# Patient Record
Sex: Male | Born: 1935 | Race: White | Hispanic: No | Marital: Married | Smoking: Former smoker
Health system: Southern US, Community
[De-identification: ages and names within clinical notes are randomized; demographics above are authoritative.]

## PROBLEM LIST (undated history)

## (undated) DIAGNOSIS — T7840XA Allergy, unspecified, initial encounter: Secondary | ICD-10-CM

## (undated) DIAGNOSIS — F0280 Dementia in other diseases classified elsewhere without behavioral disturbance: Secondary | ICD-10-CM

## (undated) DIAGNOSIS — L719 Rosacea, unspecified: Secondary | ICD-10-CM

## (undated) DIAGNOSIS — I10 Essential (primary) hypertension: Secondary | ICD-10-CM

## (undated) DIAGNOSIS — E785 Hyperlipidemia, unspecified: Secondary | ICD-10-CM

## (undated) DIAGNOSIS — M1990 Unspecified osteoarthritis, unspecified site: Secondary | ICD-10-CM

## (undated) DIAGNOSIS — K219 Gastro-esophageal reflux disease without esophagitis: Secondary | ICD-10-CM

## (undated) DIAGNOSIS — C4490 Unspecified malignant neoplasm of skin, unspecified: Secondary | ICD-10-CM

## (undated) DIAGNOSIS — J45909 Unspecified asthma, uncomplicated: Secondary | ICD-10-CM

## (undated) HISTORY — PX: SINUS LIFT WITH BONE GRAFT: SHX5306

## (undated) HISTORY — PX: REPLACEMENT TOTAL KNEE BILATERAL: SUR1225

## (undated) HISTORY — DX: Rosacea, unspecified: L71.9

## (undated) HISTORY — DX: Hyperlipidemia, unspecified: E78.5

## (undated) HISTORY — DX: Unspecified malignant neoplasm of skin, unspecified: C44.90

## (undated) HISTORY — DX: Dementia in other diseases classified elsewhere, unspecified severity, without behavioral disturbance, psychotic disturbance, mood disturbance, and anxiety: F02.80

## (undated) HISTORY — DX: Gastro-esophageal reflux disease without esophagitis: K21.9

## (undated) HISTORY — DX: Allergy, unspecified, initial encounter: T78.40XA

## (undated) HISTORY — DX: Unspecified asthma, uncomplicated: J45.909

## (undated) HISTORY — DX: Essential (primary) hypertension: I10

## (undated) HISTORY — DX: Unspecified osteoarthritis, unspecified site: M19.90

## (undated) HISTORY — PX: TONSILLECTOMY: SUR1361

---

## 2009-09-18 DIAGNOSIS — Z961 Presence of intraocular lens: Secondary | ICD-10-CM | POA: Insufficient documentation

## 2009-10-30 DIAGNOSIS — Z961 Presence of intraocular lens: Secondary | ICD-10-CM | POA: Insufficient documentation

## 2011-06-18 DIAGNOSIS — I872 Venous insufficiency (chronic) (peripheral): Secondary | ICD-10-CM | POA: Insufficient documentation

## 2011-06-18 DIAGNOSIS — E782 Mixed hyperlipidemia: Secondary | ICD-10-CM | POA: Insufficient documentation

## 2011-06-18 DIAGNOSIS — L719 Rosacea, unspecified: Secondary | ICD-10-CM | POA: Insufficient documentation

## 2011-06-18 DIAGNOSIS — K3189 Other diseases of stomach and duodenum: Secondary | ICD-10-CM | POA: Insufficient documentation

## 2011-06-18 DIAGNOSIS — M159 Polyosteoarthritis, unspecified: Secondary | ICD-10-CM | POA: Insufficient documentation

## 2011-06-18 DIAGNOSIS — J301 Allergic rhinitis due to pollen: Secondary | ICD-10-CM | POA: Insufficient documentation

## 2012-04-29 DIAGNOSIS — M1710 Unilateral primary osteoarthritis, unspecified knee: Secondary | ICD-10-CM | POA: Insufficient documentation

## 2012-04-29 DIAGNOSIS — Z96659 Presence of unspecified artificial knee joint: Secondary | ICD-10-CM | POA: Insufficient documentation

## 2012-05-05 ENCOUNTER — Encounter: Payer: Self-pay | Admitting: Orthopedic Surgery

## 2012-05-09 ENCOUNTER — Encounter: Payer: Self-pay | Admitting: Orthopedic Surgery

## 2012-06-08 ENCOUNTER — Encounter: Payer: Self-pay | Admitting: Orthopedic Surgery

## 2014-04-05 DIAGNOSIS — IMO0002 Reserved for concepts with insufficient information to code with codable children: Secondary | ICD-10-CM | POA: Insufficient documentation

## 2014-05-09 ENCOUNTER — Ambulatory Visit: Payer: Self-pay | Admitting: Specialist

## 2014-08-12 DIAGNOSIS — Z85828 Personal history of other malignant neoplasm of skin: Secondary | ICD-10-CM | POA: Insufficient documentation

## 2015-04-26 DIAGNOSIS — M1611 Unilateral primary osteoarthritis, right hip: Secondary | ICD-10-CM | POA: Insufficient documentation

## 2015-04-27 DIAGNOSIS — Z96651 Presence of right artificial knee joint: Secondary | ICD-10-CM | POA: Insufficient documentation

## 2016-08-27 DIAGNOSIS — B351 Tinea unguium: Secondary | ICD-10-CM | POA: Insufficient documentation

## 2016-08-27 DIAGNOSIS — M2041 Other hammer toe(s) (acquired), right foot: Secondary | ICD-10-CM | POA: Insufficient documentation

## 2016-08-27 DIAGNOSIS — M2042 Other hammer toe(s) (acquired), left foot: Secondary | ICD-10-CM | POA: Insufficient documentation

## 2016-08-27 DIAGNOSIS — R234 Changes in skin texture: Secondary | ICD-10-CM | POA: Insufficient documentation

## 2017-09-09 ENCOUNTER — Emergency Department
Admission: EM | Admit: 2017-09-09 | Discharge: 2017-09-09 | Disposition: A | Discharge status: Home / Self Care | Payer: Medicare Other | Attending: Emergency Medicine | Admitting: Emergency Medicine

## 2017-09-09 ENCOUNTER — Emergency Department: Payer: Medicare Other

## 2017-09-09 ENCOUNTER — Encounter: Payer: Self-pay | Admitting: Medical Oncology

## 2017-09-09 DIAGNOSIS — Y999 Unspecified external cause status: Secondary | ICD-10-CM | POA: Insufficient documentation

## 2017-09-09 DIAGNOSIS — S91111A Laceration without foreign body of right great toe without damage to nail, initial encounter: Secondary | ICD-10-CM | POA: Diagnosis present

## 2017-09-09 DIAGNOSIS — Y92017 Garden or yard in single-family (private) house as the place of occurrence of the external cause: Secondary | ICD-10-CM | POA: Diagnosis not present

## 2017-09-09 DIAGNOSIS — W268XXA Contact with other sharp object(s), not elsewhere classified, initial encounter: Secondary | ICD-10-CM | POA: Insufficient documentation

## 2017-09-09 DIAGNOSIS — Y93H2 Activity, gardening and landscaping: Secondary | ICD-10-CM | POA: Diagnosis not present

## 2017-09-09 MED ORDER — SULFAMETHOXAZOLE-TRIMETHOPRIM 800-160 MG PO TABS: 1.0000 | ORAL_TABLET | Freq: Two times a day (BID) | ORAL | 0 refills | Status: DC

## 2017-09-09 MED ORDER — LIDOCAINE HCL (PF) 1 % IJ SOLN
5.0000 mL | Freq: Once | INTRAMUSCULAR | Status: AC
  Administered 2017-09-09: 5 mL
  Filled 2017-09-09: qty 5

## 2017-09-09 NOTE — ED Triage Notes (Signed)
Pt ambulatory with reports that he stubbed his rt great toe on a stump and there is splinters under his skin.

## 2017-09-09 NOTE — ED Provider Notes (Signed)
[facility] Emergency Department Provider Note   ____________________________________________   I have reviewed the triage vital signs and the nursing notes.   HISTORY  Chief Complaint Toe Injury    HPI Curtis West is a 83 y.o. male presents to emergency department with laceration to the plantar aspect of the right great toe he sustained approximately 11 AM this morning. Patient reports he was gardening in his yard when he stumbled over a metal border sustaining a laceration. Patient denies any other injuries Patient immediately flushed the wound in the shower however he is unsure if there was any remaining debris in the wound. Patient is up-to-date on his tetanus vaccination. Patient reports intact movement and sensation to the right great toe Patient denies fever, chills, headache, vision changes, chest pain, chest tightness, shortness of breath, abdominal pain, nausea and vomiting.  History reviewed. No pertinent past medical history.  There are no active problems to display for this patient.   No past surgical history on file.  Prior to Admission medications   Medication Sig Start Date End Date Taking? Authorizing Provider  sulfamethoxazole-trimethoprim (BACTRIM DS,SEPTRA DS) 800-160 MG tablet Take 1 tablet by mouth 2 (two) times daily. 09/09/17   Chrislyn Seedorf M, PA-C    Allergies Amoxicillin  No family history on file.  Social History Social History  Substance Use Topics  . Smoking status: Not on file  . Smokeless tobacco: Not on file  . Alcohol use Not on file    Review of Systems Constitutional: Negative for fever/chills Musculoskeletal: positive for right great toe pain secondary to laceration. Skin: Negative for rash. Laceration along the plantar aspect of the right great toe. Neurological: Negative for headaches.  ____________________________________________   PHYSICAL EXAM:  VITAL SIGNS: ED Triage Vitals  Enc Vitals  Group     BP 09/09/17 1306 (!) 144/86     Pulse Rate 09/09/17 1306 79     Resp 09/09/17 1306 18     Temp 09/09/17 1306 97.7 F (36.5 C)     Temp Source 09/09/17 1306 Oral     SpO2 09/09/17 1306 94 %     Weight 09/09/17 1304 210 lb (95.3 kg)     Height 09/09/17 1304 6' (1.829 m)     Head Circumference --      Peak Flow --      Pain Score 09/09/17 1303 0     Pain Loc --      Pain Edu? --      Excl. in [place]? --     Constitutional: Alert and oriented. Well appearing and in no acute distress.  Head: Normocephalic and atraumatic. Cardiovascular: Normal rate, regular rhythm.  Good peripheral circulation. Respiratory: Normal respiratory effort without tachypnea or retractions. Good air entry to the bases with no decreased or absent breath sounds. Musculoskeletal: right great toe range of motion, sensation and motor intact. Neurologic: Normal speech and language. Skin:  Skin is warm, dry and intact. No rash noted. Laceration along the right great toe plantar aspect. Proximally 3 cm. No tendinous involvement.  Psychiatric: Mood and affect are normal. Speech and behavior are normal. Patient exhibits appropriate insight and judgement.  ____________________________________________   LABS (all labs ordered are listed, but only abnormal results are displayed)  Labs Reviewed - No data to display ____________________________________________  EKG none ____________________________________________  RADIOLOGY DG right great toe FINDINGS: Soft tissue laceration is noted consistent the given clinical history. No acute fracture or dislocation is noted. No radiopaque foreign  body is noted.  IMPRESSION: Soft tissue laceration without acute abnormality.  Imaging results reviewed, unremarkable for foreign body or acute fracture. ____________________________________________   PROCEDURES  Procedure(s) performed:  LACERATION REPAIR Performed by: Jerolyn Shin Authorized by: Jerolyn Shin Consent: Verbal consent obtained. Risks and benefits: risks, benefits and alternatives were discussed Consent given by: patient Patient identity confirmed: provided demographic data Prepped and Draped in normal sterile fashion Wound explored  Laceration Location: Right great toe, plantar aspect  Laceration Length: 3.0 cm  No Foreign Bodies seen or palpated  Anesthesia: local infiltration  Local anesthetic: lidocaine 1%  Anesthetic total: 4.0 ml  Irrigation method: syringe Amount of cleaning: standard  Skin closure: Ethilon 5-0, Monocryl 5-0  Number of sutures: Ethilon 5-0:  9 sutures; Monocryl 5-0 : 1 sutures  Technique: simple interrupted  Patient tolerance: Patient tolerated the procedure well with no immediate complications.    Critical Care performed: no ____________________________________________   INITIAL IMPRESSION / ASSESSMENT AND PLAN / ED COURSE  Pertinent labs & imaging results that were available during my care of the patient were reviewed by me and considered in my medical decision making (see chart for details).  Patient sustained a laceration right great toe  Assessment confirmed movement and sensation of the digit before and after wound closure. Laceration required suture closure as noted above. Patient tolerated procedure well. Patient prescribed Bactrim for about coverage. Pt instructed to keep wound clean and dry and will return to the emergency department or PCP for suture removal in 7-10 days days. Patient also instructed to watch for signs of infection and return if changes are noted. Patient informed of clinical course, understand medical decision-making process, and agree with plan.  ____________________________________________   FINAL CLINICAL IMPRESSION(S) / ED DIAGNOSES  Final diagnoses:  Laceration of right great toe without foreign body present or damage to nail, initial encounter       NEW MEDICATIONS STARTED DURING THIS  VISIT:  Discharge Medication List as of 09/09/2017  3:41 PM    START taking these medications   Details  sulfamethoxazole-trimethoprim (BACTRIM DS,SEPTRA DS) 800-160 MG tablet Take 1 tablet by mouth 2 (two) times daily., Starting Tue 09/09/2017, Print         Note:  This document was prepared using Dragon voice recognition software and may include unintentional dictation errors.    Jerolyn Shin, PA-C 09/09/17 1559    Lavonia Drafts, MD 09/12/17 [phone]

## 2017-09-09 NOTE — Discharge Instructions (Signed)
Take medication as prescribed. Return to emergency department if symptoms worsen and follow-up with PCP as needed.    Keep wound area clean and dry and covered and dusty dirty or wet environments. Use caution when walking to minimize flexing the toe this can put stress on the healing wound.  Continue to monitor the wound, if you notice signs of developing infection do not hesitate to follow up with her primary care or return to the emergency department.

## 2017-09-09 NOTE — ED Notes (Signed)
Pt states he was in the garden with sandals on on his right big toe got caught on a metal piece in the garden, arrives with laceration to the back of the right big toe, bleeding controlled, states tetanus status is up to date

## 2018-01-30 DIAGNOSIS — H26492 Other secondary cataract, left eye: Secondary | ICD-10-CM | POA: Insufficient documentation

## 2018-02-03 DIAGNOSIS — D649 Anemia, unspecified: Secondary | ICD-10-CM | POA: Insufficient documentation

## 2018-02-03 DIAGNOSIS — R1319 Other dysphagia: Secondary | ICD-10-CM | POA: Insufficient documentation

## 2018-02-23 DIAGNOSIS — G4733 Obstructive sleep apnea (adult) (pediatric): Secondary | ICD-10-CM | POA: Insufficient documentation

## 2018-11-09 ENCOUNTER — Ambulatory Visit: Payer: Medicare Other | Admitting: Physical Therapy

## 2018-11-11 ENCOUNTER — Ambulatory Visit: Payer: Medicare Other | Admitting: Physical Therapy

## 2018-11-16 ENCOUNTER — Ambulatory Visit: Payer: Medicare Other | Admitting: Physical Therapy

## 2018-11-18 ENCOUNTER — Ambulatory Visit: Payer: Medicare Other | Admitting: Physical Therapy

## 2018-11-23 ENCOUNTER — Ambulatory Visit: Payer: Medicare Other | Admitting: Physical Therapy

## 2018-11-25 ENCOUNTER — Ambulatory Visit: Payer: Medicare Other | Admitting: Physical Therapy

## 2018-12-03 ENCOUNTER — Ambulatory Visit: Payer: 59 | Admitting: Physical Therapy

## 2018-12-07 ENCOUNTER — Ambulatory Visit: Payer: 59 | Admitting: Physical Therapy

## 2018-12-14 ENCOUNTER — Ambulatory Visit: Payer: 59 | Admitting: Physical Therapy

## 2018-12-16 ENCOUNTER — Ambulatory Visit: Payer: 59 | Admitting: Physical Therapy

## 2018-12-21 ENCOUNTER — Ambulatory Visit: Payer: 59 | Admitting: Physical Therapy

## 2018-12-23 ENCOUNTER — Ambulatory Visit: Payer: 59 | Admitting: Physical Therapy

## 2018-12-28 ENCOUNTER — Ambulatory Visit: Payer: 59 | Admitting: Physical Therapy

## 2018-12-30 ENCOUNTER — Ambulatory Visit: Payer: 59 | Admitting: Physical Therapy

## 2019-01-04 ENCOUNTER — Ambulatory Visit: Payer: 59 | Admitting: Physical Therapy

## 2019-01-06 ENCOUNTER — Ambulatory Visit: Payer: 59 | Admitting: Physical Therapy

## 2019-01-11 ENCOUNTER — Ambulatory Visit: Payer: 59 | Admitting: Physical Therapy

## 2019-01-13 ENCOUNTER — Ambulatory Visit: Payer: 59 | Admitting: Physical Therapy

## 2019-05-06 ENCOUNTER — Other Ambulatory Visit: Payer: Self-pay

## 2019-05-06 ENCOUNTER — Emergency Department
Admission: EM | Admit: 2019-05-06 | Discharge: 2019-05-06 | Disposition: A | Discharge status: Home / Self Care | Payer: Medicare Other | Attending: Emergency Medicine | Admitting: Emergency Medicine

## 2019-05-06 ENCOUNTER — Emergency Department: Payer: Medicare Other

## 2019-05-06 DIAGNOSIS — Z87891 Personal history of nicotine dependence: Secondary | ICD-10-CM | POA: Insufficient documentation

## 2019-05-06 DIAGNOSIS — Z20828 Contact with and (suspected) exposure to other viral communicable diseases: Secondary | ICD-10-CM | POA: Diagnosis not present

## 2019-05-06 DIAGNOSIS — I491 Atrial premature depolarization: Secondary | ICD-10-CM | POA: Insufficient documentation

## 2019-05-06 DIAGNOSIS — R05 Cough: Secondary | ICD-10-CM | POA: Diagnosis not present

## 2019-05-06 DIAGNOSIS — R008 Other abnormalities of heart beat: Secondary | ICD-10-CM | POA: Diagnosis present

## 2019-05-06 DIAGNOSIS — I10 Essential (primary) hypertension: Secondary | ICD-10-CM | POA: Diagnosis not present

## 2019-05-06 LAB — COMPREHENSIVE METABOLIC PANEL
ALT: 17 U/L (ref 0–44)
AST: 21 U/L (ref 15–41)
Albumin: 4.3 g/dL (ref 3.5–5.0)
Alkaline Phosphatase: 92 U/L (ref 38–126)
Anion gap: 9 (ref 5–15)
BUN: 23 mg/dL (ref 8–23)
CO2: 28 mmol/L (ref 22–32)
Calcium: 9.1 mg/dL (ref 8.9–10.3)
Chloride: 95 mmol/L — ABNORMAL LOW (ref 98–111)
Creatinine, Ser: 1.25 mg/dL — ABNORMAL HIGH (ref 0.61–1.24)
GFR calc Af Amer: >60 mL/min (ref >60)
GFR calc non Af Amer: 52 mL/min — ABNORMAL LOW (ref >60)
Glucose, Bld: 111 mg/dL — ABNORMAL HIGH (ref 70–99)
Potassium: 3.4 mmol/L — ABNORMAL LOW (ref 3.5–5.1)
Sodium: 132 mmol/L — ABNORMAL LOW (ref 135–145)
Total Bilirubin: 0.5 mg/dL (ref 0.3–1.2)
Total Protein: 7.7 g/dL (ref 6.5–8.1)

## 2019-05-06 LAB — CBC
HCT: 40.4 % (ref 39.0–52.0)
Hemoglobin: 13 g/dL (ref 13.0–17.0)
MCH: 25.2 pg — ABNORMAL LOW (ref 26.0–34.0)
MCHC: 32.2 g/dL (ref 30.0–36.0)
MCV: 78.4 fL — ABNORMAL LOW (ref 80.0–100.0)
Platelets: 290 10*3/uL (ref 150–400)
RBC: 5.15 MIL/uL (ref 4.22–5.81)
RDW: 14.4 % (ref 11.5–15.5)
WBC: 6.5 10*3/uL (ref 4.0–10.5)
nRBC: 0 % (ref 0.0–0.2)

## 2019-05-06 LAB — BRAIN NATRIURETIC PEPTIDE: B Natriuretic Peptide: 38 pg/mL (ref 0.0–100.0)

## 2019-05-06 LAB — TROPONIN I: Troponin I: <0.03 ng/mL (ref <0.03)

## 2019-05-06 LAB — SARS CORONAVIRUS 2 BY RT PCR (HOSPITAL ORDER, PERFORMED IN ~~LOC~~ HOSPITAL LAB): SARS Coronavirus 2: NEGATIVE

## 2019-05-06 NOTE — ED Triage Notes (Signed)
Pt c/o feeling a irregular heart beat and some SOB for the past couple of days, with congestion.

## 2019-05-06 NOTE — ED Notes (Signed)
Pt ambulated from triage to Rm 31. Pt speaking with Dr. Kerman Passey at this time. NAD and no SOB noted. A&Ox4

## 2019-05-06 NOTE — ED Provider Notes (Signed)
[facility] Emergency Department Provider Note  Time seen: 10:05 AM  I have reviewed the triage vital signs and the nursing notes.   HISTORY  Chief Complaint Irregular Heart Beat    HPI Curtis West is a 85 y.o. male with a past medical history of arthritis, anemia, hypertension, hyperlipidemia, seizures presents to the emergency department for an irregular heartbeat.  According to the patient his wife is a Marine scientist and often will check his pulse and blood pressure.  He states today his pulse was very irregular so his wife wanted him to come get checked out.  Patient denies any shortness of breath.  Does state occasional cough but denies any fever.  Denies any history of atrial fibrillation or irregular heartbeat in the past.  Overall the patient appears well with no acute concerns.  History reviewed. No pertinent past medical history.  There are no active problems to display for this patient.   Past Surgical History:  Procedure Laterality Date  . TONSILLECTOMY      Prior to Admission medications   Medication Sig Start Date End Date Taking? Authorizing Provider  sulfamethoxazole-trimethoprim (BACTRIM DS,SEPTRA DS) 800-160 MG tablet Take 1 tablet by mouth 2 (two) times daily. 09/09/17   Little, Traci M, PA-C    Allergies  Allergen Reactions  . Amoxicillin     No family history on file.  Social History Social History   Tobacco Use  . Smoking status: Former Research scientist (life sciences)  . Smokeless tobacco: Never Used  Substance Use Topics  . Alcohol use: Not Currently  . Drug use: Not Currently    Review of Systems Constitutional: Negative for fever. Cardiovascular: Negative for chest pain.  Irregular pulse rate today. Respiratory: Negative for shortness of breath. Gastrointestinal: Negative for abdominal pain, vomiting  Musculoskeletal: Negative for musculoskeletal complaints Neurological: Negative for headache All other ROS  negative  ____________________________________________   PHYSICAL EXAM:  VITAL SIGNS: ED Triage Vitals  Enc Vitals Group     BP 05/06/19 0939 133/80     Pulse Rate 05/06/19 0939 88     Resp 05/06/19 0939 17     Temp 05/06/19 0939 97.9 F (36.6 C)     Temp Source 05/06/19 0939 Oral     SpO2 05/06/19 0939 96 %     Weight 05/06/19 0941 200 lb (90.7 kg)     Height 05/06/19 0941 6' (1.829 m)     Head Circumference --      Peak Flow --      Pain Score 05/06/19 0941 0     Pain Loc --      Pain Edu? --      Excl. in [place]? --    Constitutional: Alert and oriented. Well appearing and in no distress. Eyes: Normal exam ENT      Head: Normocephalic and atraumatic.      Mouth/Throat: Mucous membranes are moist. Cardiovascular: Normal rate, largely regular rhythm with occasional irregularity possible PVC/PAC. Respiratory: Normal respiratory effort without tachypnea nor retractions. Breath sounds are clear  Gastrointestinal: Soft and nontender. No distention. Musculoskeletal: Nontender with normal range of motion in all extremities Neurologic:  Normal speech and language. No gross focal neurologic deficits  Skin:  Skin is warm, dry and intact.  Psychiatric: Mood and affect are normal.  ____________________________________________    EKG  EKG viewed and interpreted by myself shows a normal sinus rhythm 86 bpm with a narrow QRS, normal axis, normal intervals, no concerning ST changes.  ____________________________________________  RADIOLOGY   Chest x-ray is negative  ____________________________________________   INITIAL IMPRESSION / ASSESSMENT AND PLAN / ED COURSE  Pertinent labs & imaging results that were available during my care of the patient were reviewed by me and considered in my medical decision making (see chart for details).   Patient presents emergency department for an irregular heartbeat found by his wife this morning.  Patient states he has no symptoms.  Denies  feeling his heart beat irregularly.  Denies feeling short of breath.  Does state occasional cough.  On examination patient appears to have a largely regular rhythm with occasional ectopic beat.  EKG appears to show a normal sinus rhythm.  On rhythm strip he does appear to have occasional PACs.  We will check labs a chest x-ray and a corona swab as a precaution.  Patient agreeable to plan of care.  Patient's work-up is essentially nonrevealing.  Negative troponin.  Normal chest x-ray.  Corona test is negative.  Highly suspect PACs to be the cause of the patient's palpitation or irregular heartbeat noted this morning.  Patient will follow-up with his doctor.    Curtis West was evaluated in Emergency Department on 05/06/2019 for the symptoms described in the history of present illness. He was evaluated in the context of the global COVID-19 pandemic, which necessitated consideration that the patient might be at risk for infection with the SARS-CoV-2 virus that causes COVID-19. Institutional protocols and algorithms that pertain to the evaluation of patients at risk for COVID-19 are in a state of rapid change based on information released by regulatory bodies including the CDC and federal and state organizations. These policies and algorithms were followed during the patient's care in the ED.  ____________________________________________   FINAL CLINICAL IMPRESSION(S) / ED DIAGNOSES  Premature atrial complexes Palpitations   Harvest Dark, MD 05/06/19 1210

## 2019-05-06 NOTE — ED Notes (Signed)
Pt in bed resting comfortably.

## 2019-12-15 ENCOUNTER — Emergency Department: Payer: Medicare Other

## 2019-12-15 ENCOUNTER — Other Ambulatory Visit: Payer: Self-pay

## 2019-12-15 ENCOUNTER — Emergency Department
Admission: EM | Admit: 2019-12-15 | Discharge: 2019-12-15 | Disposition: A | Discharge status: Home / Self Care | Payer: Medicare Other | Attending: Student | Admitting: Student

## 2019-12-15 DIAGNOSIS — I10 Essential (primary) hypertension: Secondary | ICD-10-CM | POA: Diagnosis not present

## 2019-12-15 DIAGNOSIS — R1011 Right upper quadrant pain: Secondary | ICD-10-CM

## 2019-12-15 DIAGNOSIS — J45909 Unspecified asthma, uncomplicated: Secondary | ICD-10-CM | POA: Diagnosis not present

## 2019-12-15 DIAGNOSIS — K8050 Calculus of bile duct without cholangitis or cholecystitis without obstruction: Secondary | ICD-10-CM | POA: Diagnosis not present

## 2019-12-15 DIAGNOSIS — Z87891 Personal history of nicotine dependence: Secondary | ICD-10-CM | POA: Diagnosis not present

## 2019-12-15 DIAGNOSIS — F0390 Unspecified dementia without behavioral disturbance: Secondary | ICD-10-CM | POA: Insufficient documentation

## 2019-12-15 LAB — CBC
HCT: 38.6 % — ABNORMAL LOW (ref 39.0–52.0)
Hemoglobin: 12.5 g/dL — ABNORMAL LOW (ref 13.0–17.0)
MCH: 27.5 pg (ref 26.0–34.0)
MCHC: 32.4 g/dL (ref 30.0–36.0)
MCV: 84.8 fL (ref 80.0–100.0)
Platelets: 275 10*3/uL (ref 150–400)
RBC: 4.55 MIL/uL (ref 4.22–5.81)
RDW: 14.1 % (ref 11.5–15.5)
WBC: 11.4 10*3/uL — ABNORMAL HIGH (ref 4.0–10.5)
nRBC: 0 % (ref 0.0–0.2)

## 2019-12-15 LAB — URINALYSIS, COMPLETE (UACMP) WITH MICROSCOPIC
Bacteria, UA: NONE SEEN
Bilirubin Urine: NEGATIVE
Glucose, UA: NEGATIVE mg/dL
Hgb urine dipstick: NEGATIVE
Ketones, ur: NEGATIVE mg/dL
Leukocytes,Ua: NEGATIVE
Nitrite: NEGATIVE
Protein, ur: 30 mg/dL — AB
Specific Gravity, Urine: 1.019 (ref 1.005–1.030)
Squamous Epithelial / HPF: NONE SEEN (ref 0–5)
pH: 6 (ref 5.0–8.0)

## 2019-12-15 LAB — COMPREHENSIVE METABOLIC PANEL
ALT: 16 U/L (ref 0–44)
AST: 17 U/L (ref 15–41)
Albumin: 3.1 g/dL — ABNORMAL LOW (ref 3.5–5.0)
Alkaline Phosphatase: 83 U/L (ref 38–126)
Anion gap: 9 (ref 5–15)
BUN: 20 mg/dL (ref 8–23)
CO2: 24 mmol/L (ref 22–32)
Calcium: 8.5 mg/dL — ABNORMAL LOW (ref 8.9–10.3)
Chloride: 97 mmol/L — ABNORMAL LOW (ref 98–111)
Creatinine, Ser: 1 mg/dL (ref 0.61–1.24)
GFR calc Af Amer: >60 mL/min (ref >60)
GFR calc non Af Amer: >60 mL/min (ref >60)
Glucose, Bld: 131 mg/dL — ABNORMAL HIGH (ref 70–99)
Potassium: 3.5 mmol/L (ref 3.5–5.1)
Sodium: 130 mmol/L — ABNORMAL LOW (ref 135–145)
Total Bilirubin: 0.4 mg/dL (ref 0.3–1.2)
Total Protein: 7.1 g/dL (ref 6.5–8.1)

## 2019-12-15 LAB — TROPONIN I (HIGH SENSITIVITY): Troponin I (High Sensitivity): 11 ng/L (ref <18)

## 2019-12-15 LAB — LIPASE, BLOOD: Lipase: 29 U/L (ref 11–51)

## 2019-12-15 MED ORDER — SODIUM CHLORIDE 0.9% FLUSH: 3.0000 mL | Freq: Once | INTRAVENOUS | Status: DC

## 2019-12-15 NOTE — ED Provider Notes (Signed)
[facility] Emergency Department Provider Note  ____________________________________________   First MD Initiated Contact with Patient 12/15/19 1610     (approximate)  I have reviewed the triage vital signs and the nursing notes.  History  Chief Complaint Abdominal Pain    HPI Curtis West is a 86 y.o. male with PMHx as below who presents for RUQ and epigastric abdominal pain. Wife states patient has had intermittent episodes of epigastric and RUQ abdominal pain over the last 4 days. Always occurs after eating. Pain is sharp, no radiation, pain is elicited/aggravated by eating, as above.  No history of the same.  No history of intra-abdominal surgery.  No associated fevers, nausea, vomiting, diarrhea.  Wife reports associated weakness and increased sleeping over the last week as well, which is abnormal for the patient despite his dementia.  Last episode of pain occurred just prior to arrival, after the patient ate a ham sandwich.  At present, he reports pain is minimal, 1/10 in severity.  History primarily obtained from wife due to patient's hx of dementia.   Past Medical Hx Dementia Arthritis Asthma GERD HLD HTN OSA  Problem List As above  Past Surgical Hx Past Surgical History:  Procedure Laterality Date  . SINUS LIFT WITH BONE GRAFT    . TONSILLECTOMY      Medications No daily medications per wife  Allergies Amoxicillin  Family Hx No family history on file.  Social Hx Social History   Tobacco Use  . Smoking status: Former Research scientist (life sciences)  . Smokeless tobacco: Never Used  Substance Use Topics  . Alcohol use: Not Currently  . Drug use: Not Currently     Review of Systems  Constitutional: Negative for fever, chills. Eyes: Negative for visual changes. ENT: Negative for sore throat. Cardiovascular: Negative for chest pain. Respiratory: Negative for shortness of breath. Gastrointestinal: + abdominal pain Genitourinary: Negative  for dysuria. Musculoskeletal: Negative for leg swelling. Skin: Negative for rash. Neurological: Negative for for headaches.   Physical Exam  Vital Signs: ED Triage Vitals  Enc Vitals Group     BP 12/15/19 1535 129/70     Pulse Rate 12/15/19 1535 (!) 102     Resp 12/15/19 1535 18     Temp 12/15/19 1535 97.6 F (36.4 C)     Temp Source 12/15/19 1535 Oral     SpO2 12/15/19 1535 98 %     Weight 12/15/19 1536 170 lb (77.1 kg)     Height 12/15/19 1536 5\' 10"  (1.778 m)     Head Circumference --      Peak Flow --      Pain Score 12/15/19 1547 0     Pain Loc --      Pain Edu? --      Excl. in [place]? --     Constitutional: Awake and alert.  Sleeping, awakens easily to voice.  Pleasantly demented. Head: Normocephalic. Atraumatic. Eyes: Conjunctivae clear. Sclera anicteric. Nose: No congestion. No rhinorrhea. Mouth/Throat: Wearing mask.  Neck: No stridor.   Cardiovascular: Normal rate, regular rhythm. Extremities well perfused. Respiratory: Normal respiratory effort.  Lungs CTAB. Gastrointestinal: Soft.  Mild tenderness to palpation in the epigastrium and right upper quadrant.  No peritoneal signs.  Remainder abdomen soft nontender. Musculoskeletal: No lower extremity edema. No deformities. Neurologic:  Normal speech and language. No gross focal neurologic deficits are appreciated.  Skin: Skin is warm, dry and intact. No rash noted. Psychiatric: Mood and affect are appropriate for situation.  EKG  Personally  reviewed.   Rate: 86 Rhythm: sinus Axis: normal Intervals: WNL No acute ischemic changes No STEMI    Radiology  [place]: IMPRESSION: There is cholelithiasis without secondary signs of acute cholecystitis.     Procedures  Procedure(s) performed (including critical care):  Procedures   Initial Impression / Assessment and Plan / ED Course  86 y.o. male who presents to the ED for epigastric, right upper quadrant abdominal pain, primarily postprandial.  As  above.  Ddx: pancreatitis, biliary colic, cholecystitis, esophagitis, atypical ACS  Will obtain labs, ultrasound, EKG  Ultrasound reveals cholelithiasis without secondary signs of acute cholecystitis.  Bilirubin, hepatic panel, lipase within normal limits.  Minimally elevated WBC at 11.4.  Afebrile.  Do not suspect acute cholecystitis or cholangitis.  Troponin negative.  Will PO challenge here to see if patient tolerates.  If passes, will plan for outpatient referral to general surgery.  Patient able to tolerate PO in the ED without any nausea, vomiting, or recurrence of severe pain.  As such, patient stable for discharge with outpatient follow-up.  Placed referral for general surgery.  Discussed avoiding greasy, fatty, fried foods and adhering to a bland type diet.  Discussed strict return precautions.  Patient and wife voiced understanding and are comfortable with the plan and discharge.   Final Clinical Impression(s) / ED Diagnosis  Final diagnoses:  RUQ pain  RUQ abdominal pain  Biliary colic       Note:  This document was prepared using Dragon voice recognition software and may include unintentional dictation errors.   Lilia Pro., MD 12/15/19 [phone]

## 2019-12-15 NOTE — ED Notes (Signed)
This nurse unable to get vital signs prior to discharge due to pt unable to stay still and him taking off blood pressure cuff and oximeter

## 2019-12-15 NOTE — Discharge Instructions (Addendum)
Thank you for letting us take care of you in the emergency department today.  Your work-up showed that you have gallstones.  Please continue to take any regular, prescribed medications.   Try to avoid greasy, fried, fatty foods as this can worsen your pain.  Please follow up with: - Your primary care doctor to review your ER visit and follow up on your symptoms.  - General surgery doctor, to follow-up on your gallstones  Please return to the ER for any new or worsening symptoms, such as recurrent or worsening pain, nausea, vomiting, inability to eat, fevers.

## 2019-12-15 NOTE — ED Triage Notes (Addendum)
Pt is here with his wife who states the pt has had RUQ pain intermittently for the past 4 days. Denies N/V/D.Marland Kitchenper pt wife the pt has been extremely weak unable to walk on his own over the past 4 days.

## 2019-12-15 NOTE — ED Notes (Signed)
This RN gave pt saltine crackers to PO challenge per MD Monks. Pt able to tolerate PO challenge. Pt c/o "slight pain" in chest/upper abdomen area. MD [facility] aware.

## 2019-12-15 NOTE — Progress Notes (Signed)
   12/15/19 1900  Clinical Encounter Type  Visited With Family;Patient and family together;Health care provider  Visit Type Initial  Referral From Nurse  Stress Factors  Patient Stress Factors Health changes  Family Stress Factors Health changes   Chaplain received a referral to assist the patient and his wife with completion of AD documents with the assistance of an outside notary. The patient's son and daughter-in-law shared the circumstances and urgency in helping the patient and his wife to complete the paperwork. Chaplain discussed challenges of finalizing in the COVID-19 hospital environment because of visitor restrictions and lack of witnesses. After careful consideration of the needs, challenges, and options available it was decided that the patient will be best served by seeking to complete the documents after discharge. The family expressed understanding and will proceed as recommended.

## 2019-12-23 ENCOUNTER — Ambulatory Visit (INDEPENDENT_AMBULATORY_CARE_PROVIDER_SITE_OTHER): Payer: Medicare Other | Admitting: General Surgery

## 2019-12-23 ENCOUNTER — Other Ambulatory Visit: Payer: Self-pay

## 2019-12-23 ENCOUNTER — Encounter: Payer: Self-pay | Admitting: General Surgery

## 2019-12-23 VITALS — BP 151/79 | HR 82 | Temp 97.9°F | Resp 14 | Ht 69.0 in | Wt 178.8 lb

## 2019-12-23 DIAGNOSIS — K8020 Calculus of gallbladder without cholecystitis without obstruction: Secondary | ICD-10-CM | POA: Diagnosis not present

## 2019-12-23 DIAGNOSIS — J45909 Unspecified asthma, uncomplicated: Secondary | ICD-10-CM | POA: Insufficient documentation

## 2019-12-23 DIAGNOSIS — M1990 Unspecified osteoarthritis, unspecified site: Secondary | ICD-10-CM | POA: Insufficient documentation

## 2019-12-23 DIAGNOSIS — J302 Other seasonal allergic rhinitis: Secondary | ICD-10-CM | POA: Insufficient documentation

## 2019-12-23 NOTE — Progress Notes (Signed)
Patient ID: Curtis West, male   DOB: 21-Aug-1932, 86 y.o.   MRN: [id number]  Chief Complaint  Patient presents with  . Hospitalization Follow-up    New pt- ED referral - RUQ pain- Gallbladder    HPI Curtis West is a 86 y.o. male.   He is here today as a referral from the emergency department.  About a week ago, he was waiting to see his neurologist at the [facility] clinic when he developed severe abdominal pain.  According to the family and the emergency department notes, he had just eaten a ham sandwich.  His evaluation in the emergency department included a right upper quadrant ultrasound that demonstrated the presence of gallstones.  There was no gallbladder wall thickening, no pericholecystic fluid, no ductal dilatation.  His pain resolved while in the emergency department and he tolerated an oral challenge.  He was therefore discharged home and referred to our clinic for further evaluation.  Today, he is accompanied by his wife and his daughter-in-law.  The daughter-in-law provides most of the history, due to the patient's significant memory loss due to Alzheimer's disease; he is unable to recall the events of that day.  This was the first episode of this type of pain; he has never had similar in the past.  He has never had jaundice.  He has never had pancreatitis.  The emergency department visit was extremely challenging for the patient, as well as his family.  They are very hopeful that surgery will not be required.   Past Medical History:  Diagnosis Date  . Allergy    Seasonal Allergies  . Alzheimer disease ([place])   . Arthritis   . Asthma   . GERD (gastroesophageal reflux disease)   . Hyperlipidemia   . Hypertension   . Rosacea   . Skin cancer     Past Surgical History:  Procedure Laterality Date  . REPLACEMENT TOTAL KNEE BILATERAL Bilateral   . SINUS LIFT WITH BONE GRAFT    . TONSILLECTOMY      Family History  Problem Relation Age of Onset  . ALS Father 110     Social History Social History   Tobacco Use  . Smoking status: Former Research scientist (life sciences)  . Smokeless tobacco: Never Used  Substance Use Topics  . Alcohol use: Not Currently  . Drug use: Not Currently    Allergies  Allergen Reactions  . Amoxicillin     Current Outpatient Medications  Medication Sig Dispense Refill  . Melatonin 5 MG CAPS Take by mouth.    . naproxen sodium (ALEVE) 220 MG tablet Take by mouth.    . rivastigmine (EXELON) 1.5 MG capsule Take by mouth.    . sulfamethoxazole-trimethoprim (BACTRIM DS,SEPTRA DS) 800-160 MG tablet Take 1 tablet by mouth 2 (two) times daily. (Patient not taking: Reported on 12/23/2019) 20 tablet 0   No current facility-administered medications for this visit.    Review of Systems Review of Systems  Unable to perform ROS: Dementia    Blood pressure (!) 151/79, pulse 82, temperature 97.9 F (36.6 C), temperature source Temporal, resp. rate 14, height 5\' 9"  (1.753 m), weight 178 lb 12.8 oz (81.1 kg), SpO2 96 %. Body mass index is 26.4 kg/m.  Physical Exam Physical Exam Constitutional:      General: He is not in acute distress.    Appearance: Normal appearance. He is normal weight.  HENT:     Head: Normocephalic.     Nose:     Comments: Covered with a  mask secondary to COVID-19 precautions    Mouth/Throat:     Comments: Covered with a mask secondary to COVID-19 precautions Eyes:     General: No scleral icterus.       Right eye: No discharge.        Left eye: No discharge.     Conjunctiva/sclera: Conjunctivae normal.  Neck:     Comments: No palpable thyromegaly or dominant thyroid masses appreciated. Cardiovascular:     Rate and Rhythm: Normal rate and regular rhythm.  Pulmonary:     Effort: Pulmonary effort is normal.     Breath sounds: Normal breath sounds.  Abdominal:     General: Abdomen is flat. Bowel sounds are normal. There is no distension.     Palpations: Abdomen is soft.     Tenderness: There is no abdominal  tenderness. There is no guarding or rebound.     Comments: Murphy sign negative  Genitourinary:    Comments: Deferred Musculoskeletal:     Cervical back: No rigidity.     Right lower leg: Edema present.     Left lower leg: Edema present.  Lymphadenopathy:     Cervical: No cervical adenopathy.  Skin:    General: Skin is warm and dry.     Coloration: Skin is not jaundiced.  Neurological:     General: No focal deficit present.     Mental Status: He is alert. Mental status is at baseline.  Psychiatric:        Mood and Affect: Mood normal.        Behavior: Behavior normal.     Data Reviewed I reviewed the emergency department visit notes from December 15, 2019. Labs at that visit showed mild hyponatremia but normal liver function studies and bilirubin.  He had a very mild leukocytosis. Results for CHONG, MAUSOLF "BUD" (MRN [id number]) as of 12/23/2019 14:33  Ref. Range 12/15/2019 15:51  Sodium Latest Ref Range: 135 - 145 mmol/L 130 (L)  Potassium Latest Ref Range: 3.5 - 5.1 mmol/L 3.5  Chloride Latest Ref Range: 98 - 111 mmol/L 97 (L)  CO2 Latest Ref Range: 22 - 32 mmol/L 24  Glucose Latest Ref Range: 70 - 99 mg/dL 131 (H)  BUN Latest Ref Range: 8 - 23 mg/dL 20  Creatinine Latest Ref Range: 0.61 - 1.24 mg/dL 1.00  Calcium Latest Ref Range: 8.9 - 10.3 mg/dL 8.5 (L)  Anion gap Latest Ref Range: 5 - 15  9  Alkaline Phosphatase Latest Ref Range: 38 - 126 U/L 83  Albumin Latest Ref Range: 3.5 - 5.0 g/dL 3.1 (L)  Lipase Latest Ref Range: 11 - 51 U/L 29  AST Latest Ref Range: 15 - 41 U/L 17  ALT Latest Ref Range: 0 - 44 U/L 16  Total Protein Latest Ref Range: 6.5 - 8.1 g/dL 7.1  Total Bilirubin Latest Ref Range: 0.3 - 1.2 mg/dL 0.4  GFR, Est Non African American Latest Ref Range: >60 mL/min >60  GFR, Est African American Latest Ref Range: >60 mL/min >60  WBC Latest Ref Range: 4.0 - 10.5 K/uL 11.4 (H)  RBC Latest Ref Range: 4.22 - 5.81 MIL/uL 4.55  Hemoglobin Latest Ref Range: 13.0 -  17.0 g/dL 12.5 (L)  HCT Latest Ref Range: 39.0 - 52.0 % 38.6 (L)  MCV Latest Ref Range: 80.0 - 100.0 fL 84.8  MCH Latest Ref Range: 26.0 - 34.0 pg 27.5  MCHC Latest Ref Range: 30.0 - 36.0 g/dL 32.4  RDW Latest Ref Range: 11.5 - 15.5 %  14.1  Platelets Latest Ref Range: 150 - 400 K/uL 275  nRBC Latest Ref Range: 0.0 - 0.2 % 0.0   The ultrasound was reviewed.  I agree with the radiologist's assessment which is copied here: CLINICAL DATA:  Pain.  EXAM: ULTRASOUND ABDOMEN LIMITED RIGHT UPPER QUADRANT  COMPARISON:  None.  FINDINGS: Gallbladder:  The gallbladder is contracted and filled with gallstones. The gallbladder wall is not thickened. There is no pericholecystic free fluid. The sonographic Percell Miller sign is negative.  Common bile duct:  Diameter: 2 mm  Liver:  No focal lesion identified. Within normal limits in parenchymal echogenicity. Portal vein is patent on color Doppler imaging with normal direction of blood flow towards the liver.  Other: None.  IMPRESSION: There is cholelithiasis without secondary signs of acute cholecystitis.  I also reviewed the recent clinic visit with the patient's primary care provider, as found in the electronic medical record.  This discusses his progressive dementia with behavioral issues associated.  The family is considering memory care placement.  Assessment This is an 86 year old man who presented to the emergency department on December 15, 2019 with abdominal pain the findings at that visit suggested symptomatic cholelithiasis.  According to the patient's daughter-in-law, they have modified his dietary intake to decrease the fat and he has not had another episode.  He has never had a prior episode either.  Due to his significant Alzheimer's, complicated by sundowning and behavioral changes, they are eager to avoid surgical intervention if at all possible.  Plan I discussed gallstones and the potential complications related to  gallstones with the patient and his family.  I discussed the potential issues such as choledocholithiasis/jaundice, ascending cholangitis, pancreatitis, acute calculus cholecystitis.  Patient has experienced none of these.  In light of his advanced age and other medical comorbidities, I think he is best managed conservatively.  The patient and his family were provided with dietary recommendations.  Certainly, should there be a complication related to his gallstones, such as the aforementioned issues, we would need to readdress the need for surgery.  Barring those, however, I think he is best served by a watchful waiting approach.  The family agreed and was quite thankful to hear this news.  We will see him on an as-needed basis.    Fredirick Maudlin 12/23/2019, 2:16 PM

## 2019-12-23 NOTE — Patient Instructions (Addendum)
Dr.Cannon discussed with patient and patient's family that she would recommend patient to try a control-diet and avoid surgery. Below we have attached a eating plan to help patient that may suffer from gallbladder issue(s). If you have any questions or concerns to arise, please contact our office.  Gallbladder Eating Plan If you have a gallbladder condition, you may have trouble digesting fats. Eating a low-fat diet can help reduce your symptoms, and may be helpful before and after having surgery to remove your gallbladder (cholecystectomy). Your health care provider may recommend that you work with a diet and nutrition specialist (dietitian) to help you reduce the amount of fat in your diet. What are tips for following this plan? General guidelines  Limit your fat intake to less than 30% of your total daily calories. If you eat around 1,800 calories each day, this is less than 60 grams (g) of fat per day.  Fat is an important part of a healthy diet. Eating a low-fat diet can make it hard to maintain a healthy body weight. Ask your dietitian how much fat, calories, and other nutrients you need each day.  Eat small, frequent meals throughout the day instead of three large meals.  Drink at least 8-10 cups of fluid a day. Drink enough fluid to keep your urine clear or pale yellow.  Limit alcohol intake to no more than 1 drink a day for nonpregnant women and 2 drinks a day for men. One drink equals 12 oz of beer, 5 oz of wine, or 1 oz of hard liquor. Reading food labels  Check Nutrition Facts on food labels for the amount of fat per serving. Choose foods with less than 3 grams of fat per serving. Shopping  Choose nonfat and low-fat healthy foods. Look for the words "nonfat," "low fat," or "fat free."  Avoid buying processed or prepackaged foods. Cooking  Cook using low-fat methods, such as baking, broiling, grilling, or boiling.  Cook with small amounts of healthy fats, such as olive oil,  grapeseed oil, canola oil, or sunflower oil. What foods are recommended?   All fresh, frozen, or canned fruits and vegetables.  Whole grains.  Low-fat or non-fat (skim) milk and yogurt.  Lean meat, skinless poultry, fish, eggs, and beans.  Low-fat protein supplement powders or drinks.  Spices and herbs. What foods are not recommended?  High-fat foods. These include baked goods, fast food, fatty cuts of meat, ice cream, french toast, sweet rolls, pizza, cheese bread, foods covered with butter, creamy sauces, or cheese.  Fried foods. These include french fries, tempura, battered fish, breaded chicken, fried breads, and sweets.  Foods with strong odors.  Foods that cause bloating and gas. Summary  A low-fat diet can be helpful if you have a gallbladder condition, or before and after gallbladder surgery.  Limit your fat intake to less than 30% of your total daily calories. This is about 60 g of fat if you eat 1,800 calories each day.  Eat small, frequent meals throughout the day instead of three large meals. This information is not intended to replace advice given to you by your health care provider. Make sure you discuss any questions you have with your health care provider. Document Revised: 03/18/2019 Document Reviewed: 01/02/2017 Elsevier Patient Education  [place].

## 2020-09-30 ENCOUNTER — Emergency Department
Admission: EM | Admit: 2020-09-30 | Discharge: 2020-09-30 | Disposition: A | Discharge status: Skilled Nursing Facility | Payer: Medicare Other | Attending: Emergency Medicine | Admitting: Emergency Medicine

## 2020-09-30 ENCOUNTER — Emergency Department: Payer: Medicare Other

## 2020-09-30 ENCOUNTER — Other Ambulatory Visit: Payer: Self-pay

## 2020-09-30 DIAGNOSIS — F0390 Unspecified dementia without behavioral disturbance: Secondary | ICD-10-CM | POA: Diagnosis not present

## 2020-09-30 DIAGNOSIS — J45909 Unspecified asthma, uncomplicated: Secondary | ICD-10-CM | POA: Insufficient documentation

## 2020-09-30 DIAGNOSIS — Z96653 Presence of artificial knee joint, bilateral: Secondary | ICD-10-CM | POA: Diagnosis not present

## 2020-09-30 DIAGNOSIS — M25561 Pain in right knee: Secondary | ICD-10-CM | POA: Diagnosis not present

## 2020-09-30 DIAGNOSIS — Z85828 Personal history of other malignant neoplasm of skin: Secondary | ICD-10-CM | POA: Diagnosis not present

## 2020-09-30 DIAGNOSIS — S0990XA Unspecified injury of head, initial encounter: Secondary | ICD-10-CM | POA: Diagnosis present

## 2020-09-30 DIAGNOSIS — S0001XA Abrasion of scalp, initial encounter: Secondary | ICD-10-CM | POA: Diagnosis not present

## 2020-09-30 DIAGNOSIS — I10 Essential (primary) hypertension: Secondary | ICD-10-CM | POA: Insufficient documentation

## 2020-09-30 DIAGNOSIS — W06XXXA Fall from bed, initial encounter: Secondary | ICD-10-CM | POA: Diagnosis not present

## 2020-09-30 DIAGNOSIS — G8929 Other chronic pain: Secondary | ICD-10-CM | POA: Insufficient documentation

## 2020-09-30 DIAGNOSIS — W19XXXA Unspecified fall, initial encounter: Secondary | ICD-10-CM

## 2020-09-30 DIAGNOSIS — Z87891 Personal history of nicotine dependence: Secondary | ICD-10-CM | POA: Diagnosis not present

## 2020-09-30 DIAGNOSIS — F0391 Unspecified dementia with behavioral disturbance: Secondary | ICD-10-CM

## 2020-09-30 DIAGNOSIS — F03918 Unspecified dementia, unspecified severity, with other behavioral disturbance: Secondary | ICD-10-CM

## 2020-09-30 NOTE — Discharge Instructions (Addendum)

## 2020-09-30 NOTE — ED Notes (Signed)
Pt trying to get OOB and asking to see wife. Helped pt urinate in urinal. Pt resting in bed now.

## 2020-09-30 NOTE — ED Notes (Signed)
Patient returned from [facility] on stretcher with technician. Placed back in room with stretcher low and locked, side rails raised x2, cardiac monitor in place, and call bell in reach.

## 2020-09-30 NOTE — ED Triage Notes (Signed)
Unwitnessed mechanical fall from standing at [facility]. Reports hitting head on floor and denies LOC. Denies daily thinners. Denies h/a, n/v, dizziness, numbness. Denies chest pain or SOB. Pupils equal and reactive. Abrasion noted to L skull. Bleeding controlled on arrival. Pt alert and following commands appropriately.

## 2020-09-30 NOTE — ED Notes (Signed)
Son at bedside. preparing to discharge.

## 2020-09-30 NOTE — ED Notes (Signed)
Patient transported to CT with technician on stretcher.  

## 2020-09-30 NOTE — ED Notes (Signed)
Patient placed in bed with bed low and locked and side rails raised x2. Call bell in reach and cardiac monitor in place. Breathing easy and unlabored speaking in full sentences and with noted symmetric chest rise and fall.

## 2020-09-30 NOTE — ED Notes (Signed)
Pt provided cola per request. Awaiting family to pick pt up.

## 2020-09-30 NOTE — ED Notes (Signed)
X-ray at patient bedside completing exam.

## 2020-09-30 NOTE — ED Notes (Signed)
Abrasion to pt L side of head cleansed with warm water and CHG soap. Dried blood cleaned from patient hair and forehead as well. Bleeding remains controlled and left to air to dry until provider evaluation and recommendation for dressing.

## 2020-09-30 NOTE — ED Provider Notes (Signed)
[facility] Emergency Department Provider Note  ____________________________________________   First MD Initiated Contact with Patient 09/30/20 [phone]     (approximate)  I have reviewed the triage vital signs and the nursing notes.   HISTORY  Chief Complaint Fall and Head Laceration  Level 5 caveat:  history/ROS limited by chronic dementia  HPI Curtis West is a 87 y.o. male with a history of dementia as well as other medical issues as listed below who lives at [facility] in the memory care unit.  He is brought in tonight by EMS for evaluation after a fall.  The patient gives what seems to be a solid history.  He says that he was asleep in bed and try to get up and somehow became over balance when he was reaching for the light and fell, striking his head on the bedside.  He does not believe that he lost consciousness but was unable to get back up without assistance.  He had some bleeding from his scalp that it stopped by the time EMS arrived.  He says that his head hurts at the spot of impact but not with a global headache.  He denies neck pain, chest pain, shortness of breath, nausea, vomiting, and abdominal pain.  He said that it was simply an accident and that he fell over because he was off balance not because he passed out or was feeling ill.  The onset was acute and potentially severe, nothing in particular made it better or worse.         Past Medical History:  Diagnosis Date  . Allergy    Seasonal Allergies  . Alzheimer disease ([place])   . Arthritis   . Asthma   . GERD (gastroesophageal reflux disease)   . Hyperlipidemia   . Hypertension   . Rosacea   . Skin cancer     Patient Active Problem List   Diagnosis Date Noted  . Arthritis 12/23/2019  . Asthma without status asthmaticus 12/23/2019  . Seasonal allergies 12/23/2019  . Calculus of gallbladder without cholecystitis without obstruction 12/23/2019  . OSA (obstructive sleep apnea)  02/23/2018  . Esophageal dysphagia 02/03/2018  . Normocytic anemia 02/03/2018  . Posterior capsular opacification non visually significant of left eye 01/30/2018  . Fissure in skin of foot 08/27/2016  . Hammer toes of both feet 08/27/2016  . Onychomycosis due to dermatophyte 08/27/2016  . H/O total knee replacement, right 04/27/2015  . Primary osteoarthritis of right hip 04/26/2015  . Personal history of other malignant neoplasm of skin 08/12/2014  . Squamous cell carcinoma 04/05/2014  . Arthritis of knee 04/29/2012  . S/P knee replacement 04/29/2012  . Allergic rhinitis due to pollen 06/18/2011  . Dyspepsia and other specified disorders of function of stomach 06/18/2011  . Mixed hyperlipidemia 06/18/2011  . Osteoarthritis of multiple joints 06/18/2011  . Rosacea 06/18/2011  . Venous (peripheral) insufficiency 06/18/2011  . Pseudophakia of right eye 10/30/2009  . Pseudophakia of left eye 09/18/2009    Past Surgical History:  Procedure Laterality Date  . REPLACEMENT TOTAL KNEE BILATERAL Bilateral   . SINUS LIFT WITH BONE GRAFT    . TONSILLECTOMY      Prior to Admission medications   Medication Sig Start Date End Date Taking? Authorizing Provider  Melatonin 5 MG CAPS Take by mouth.    [provider]  naproxen sodium (ALEVE) 220 MG tablet Take by mouth.    [provider]  rivastigmine (EXELON) 1.5 MG capsule Take by mouth.  12/16/19 01/15/20  [provider]  sulfamethoxazole-trimethoprim (BACTRIM DS,SEPTRA DS) 800-160 MG tablet Take 1 tablet by mouth 2 (two) times daily. Patient not taking: Reported on 12/23/2019 09/09/17   Little, Traci M, PA-C    Allergies Amoxicillin and Sulfa antibiotics  Family History  Problem Relation Age of Onset  . ALS Father 72    Social History Social History   Tobacco Use  . Smoking status: Former Research scientist (life sciences)  . Smokeless tobacco: Never Used  Substance Use Topics  . Alcohol use: Not Currently  . Drug use: Not Currently     Review of Systems Level 5 caveat:  history/ROS limited by chronic dementia   Constitutional: No fever/chills Eyes: No visual changes. ENT: No sore throat. Cardiovascular: Denies chest pain. Respiratory: Denies shortness of breath. Gastrointestinal: No abdominal pain.  No nausea, no vomiting.  No diarrhea.  No constipation. Genitourinary: Negative for dysuria. Musculoskeletal: Pain on of the scalp secondary to contusion. Integumentary: Scalp laceration or abrasion Neurological: Negative for headaches, focal weakness or numbness.   ____________________________________________   PHYSICAL EXAM:  VITAL SIGNS: ED Triage Vitals  Enc Vitals Group     BP 09/30/20 0552 (!) 175/97     Pulse Rate 09/30/20 0552 72     Resp 09/30/20 0552 18     Temp 09/30/20 0552 98.3 F (36.8 C)     Temp Source 09/30/20 0552 Oral     SpO2 09/30/20 0552 100 %     Weight 09/30/20 0552 93 kg (205 lb)     Height 09/30/20 0552 1.753 m (5\' 9" )     Head Circumference --      Peak Flow --      Pain Score 09/30/20 0551 3     Pain Loc --      Pain Edu? --      Excl. in [place]? --     Constitutional: Alert and oriented to self and location and recent events. Eyes: Conjunctivae are normal.  Head: Abrasion and superficial skin tear to the center of his scalp.  No active bleeding.  Mild tenderness to palpation. Nose: No congestion/rhinnorhea. Mouth/Throat: Patient is wearing a mask. Neck: No stridor.  No meningeal signs.   Cardiovascular: Normal rate, regular rhythm. Good peripheral circulation. Grossly normal heart sounds. Respiratory: Normal respiratory effort.  No retractions. Gastrointestinal: Soft and nontender. No distention.  Musculoskeletal: Patient has some mild tenderness to palpation and mild pain with manipulation of the right knee but there is no swelling or effusion.  Reassuring external exam with Neurologic:  Normal speech and language. No gross focal neurologic deficits are appreciated.    Skin:  Skin is warm, dry and intact except for scalp abrasion as described above. Psychiatric: Mood and affect are normal. Speech and behavior are normal.  ____________________________________________   LABS (all labs ordered are listed, but only abnormal results are displayed)  Labs Reviewed - No data to display ____________________________________________  EKG  No indication for emergent EKG ____________________________________________  RADIOLOGY I, Hinda Kehr, personally viewed and evaluated these images (plain radiographs) as part of my medical decision making, as well as reviewing the written report by the radiologist.  ED MD interpretation:  No acute abnormalities identified on CTs of head and C-spine nor on x-rays of right knee  Official radiology report(s): DG Knee 2 Views Right  Result Date: 09/30/2020 CLINICAL DATA:  Pain after fall. EXAM: RIGHT KNEE - 1-2 VIEW COMPARISON:  None. FINDINGS: RIGHT knee arthroplasty hardware appears intact and appropriately positioned. No osseous  fracture or dislocation. No appreciable joint effusion. Adjacent soft tissues are unremarkable. IMPRESSION: No acute findings. No osseous fracture or dislocation. RIGHT knee arthroplasty hardware appears intact and appropriately positioned. Electronically Signed   By: Franki Cabot M.D.   On: 09/30/2020 06:24   CT Head Wo Contrast  Result Date: 09/30/2020 CLINICAL DATA:  Fall EXAM: CT HEAD WITHOUT CONTRAST CT CERVICAL SPINE WITHOUT CONTRAST TECHNIQUE: Multidetector CT imaging of the head and cervical spine was performed following the standard protocol without intravenous contrast. Multiplanar CT image reconstructions of the cervical spine were also generated. COMPARISON:  None. FINDINGS: CT HEAD FINDINGS Brain: There is no mass, hemorrhage or extra-axial collection. There is generalized atrophy without lobar predilection. There is hypoattenuation of the periventricular white matter, most commonly  indicating chronic ischemic microangiopathy. Vascular: No abnormal hyperdensity of the major intracranial arteries or dural venous sinuses. No intracranial atherosclerosis. Skull: The visualized skull base, calvarium and extracranial soft tissues are normal. Sinuses/Orbits: No fluid levels or advanced mucosal thickening of the visualized paranasal sinuses. No mastoid or middle ear effusion. The orbits are normal. CT CERVICAL SPINE FINDINGS Alignment: No static subluxation. Facets are aligned. Occipital condyles are normally positioned. Skull base and vertebrae: No acute fracture. Soft tissues and spinal canal: No prevertebral fluid or swelling. No visible canal hematoma. Disc levels: Multilevel severe facet arthrosis. Mild degenerative disc disease. No spinal canal stenosis. Upper chest: No pneumothorax, pulmonary nodule or pleural effusion. Other: Normal visualized paraspinal cervical soft tissues. IMPRESSION: 1. Chronic ischemic microangiopathy and generalized atrophy without acute intracranial abnormality. 2. No acute fracture or static subluxation of the cervical spine. Electronically Signed   By: Ulyses Jarred M.D.   On: 09/30/2020 06:49   CT Cervical Spine Wo Contrast  Result Date: 09/30/2020 CLINICAL DATA:  Fall EXAM: CT HEAD WITHOUT CONTRAST CT CERVICAL SPINE WITHOUT CONTRAST TECHNIQUE: Multidetector CT imaging of the head and cervical spine was performed following the standard protocol without intravenous contrast. Multiplanar CT image reconstructions of the cervical spine were also generated. COMPARISON:  None. FINDINGS: CT HEAD FINDINGS Brain: There is no mass, hemorrhage or extra-axial collection. There is generalized atrophy without lobar predilection. There is hypoattenuation of the periventricular white matter, most commonly indicating chronic ischemic microangiopathy. Vascular: No abnormal hyperdensity of the major intracranial arteries or dural venous sinuses. No intracranial atherosclerosis.  Skull: The visualized skull base, calvarium and extracranial soft tissues are normal. Sinuses/Orbits: No fluid levels or advanced mucosal thickening of the visualized paranasal sinuses. No mastoid or middle ear effusion. The orbits are normal. CT CERVICAL SPINE FINDINGS Alignment: No static subluxation. Facets are aligned. Occipital condyles are normally positioned. Skull base and vertebrae: No acute fracture. Soft tissues and spinal canal: No prevertebral fluid or swelling. No visible canal hematoma. Disc levels: Multilevel severe facet arthrosis. Mild degenerative disc disease. No spinal canal stenosis. Upper chest: No pneumothorax, pulmonary nodule or pleural effusion. Other: Normal visualized paraspinal cervical soft tissues. IMPRESSION: 1. Chronic ischemic microangiopathy and generalized atrophy without acute intracranial abnormality. 2. No acute fracture or static subluxation of the cervical spine. Electronically Signed   By: Ulyses Jarred M.D.   On: 09/30/2020 06:49    ____________________________________________   PROCEDURES   Procedure(s) performed (including Critical Care):  .1-3 Lead EKG Interpretation Performed by: Hinda Kehr, MD Authorized by: Hinda Kehr, MD     Interpretation: normal     ECG rate:  75   ECG rate assessment: normal     Rhythm: sinus rhythm     Ectopy:  none     Conduction: normal       ____________________________________________   INITIAL IMPRESSION / MDM / ASSESSMENT AND PLAN / ED COURSE  As part of my medical decision making, I reviewed the following data within the [facility] History obtained from family, Nursing notes reviewed and incorporated, Old chart reviewed, Radiograph reviewed  and Notes from prior ED visits   Differential diagnosis includes, but is not limited to, contusion, intracranial bleeding, cervical spine injury, skull fracture.  Patient could theoretically also have metabolic abnormalities, cardiac arrhythmia,  acute infection.  However the patient gives a very good and consistent history even after repeating the story to paramedics, nursing staff, and myself, that is consistent with a mechanical fall.  He is reporting no pain right now except some tenderness around the wound on his head and some pain in the right knee.  At this point I do not believe there is an indication for emergent lab work.  I will obtain a CT scan of his head and cervical spine to evaluate for the possibility of an emergent injury.  I reviewed the medical record and verified his history of dementia.  I also found a record from primary care that indicates that his son, Shanon Brow, is his power of attorney.  I will call his son to discuss results of the imaging and my recommendation that no additional work-up is necessary but we will discuss the case in more detail after the completion of the imaging.  The patient is on the cardiac monitor to evaluate for evidence of arrhythmia and/or significant heart rate changes.     Clinical Course as of Sep 30 730  Sat Sep 30, 2020  0632 I personally reviewed the patient's imaging and agree with the radiologist's interpretation that there is no evidence of any acute fracture dislocation or other emergent abnormality on the knee x-rays.  The prior hardware is well-positioned and the exam is reassuring.  DG Knee 2 Views Right [CF]  [phone] No acute abnormalities identified on head CT and cervical spine CT, which I personally reviewed and reviewed the report by the radiologist.  I will contact the patient's son to discuss the plan of care.   [CF]  0714 Tried to call the patient's son, Toshiyuki Fredell [phone]), and I left a HIPAA compliant voicemail message for him to call me back.   [CF]  [id number] I discussed the case by phone with the patient's son and he is comfortable with the work-up as performed thus far.  He and the patient's wife are on their way to the emergency department and the family is  comfortable transporting him back to his facility.  I updated the patient who is becoming agitated, but the son said that this is normally an unfamiliar environment and under stress.  No evidence of an emergent medical condition at this time and he will be discharged with my usual and customary return precautions.   [CF]    Clinical Course User Index [CF] Hinda Kehr, MD     ____________________________________________  FINAL CLINICAL IMPRESSION(S) / ED DIAGNOSES  Final diagnoses:  Fall, initial encounter  Abrasion of scalp, initial encounter  Minor head injury, initial encounter  Chronic dementia with behavioral disturbance ([place])  Acute pain of right knee     MEDICATIONS GIVEN DURING THIS VISIT:  Medications - No data to display   ED Discharge Orders    None      *Please note:  Curtis West was evaluated  in Emergency Department on 09/30/2020 for the symptoms described in the history of present illness. He was evaluated in the context of the global COVID-19 pandemic, which necessitated consideration that the patient might be at risk for infection with the SARS-CoV-2 virus that causes COVID-19. Institutional protocols and algorithms that pertain to the evaluation of patients at risk for COVID-19 are in a state of rapid change based on information released by regulatory bodies including the CDC and federal and state organizations. These policies and algorithms were followed during the patient's care in the ED.  Some ED evaluations and interventions may be delayed as a result of limited staffing during and after the pandemic.*  Note:  This document was prepared using Dragon voice recognition software and may include unintentional dictation errors.   Hinda Kehr, MD 09/30/20 [phone]

## 2020-09-30 NOTE — ED Notes (Signed)
Patient provided with 2 warm blankets per request. Pt remains in bed with bed low and locked, side rails raised x2 and call bell in reach. Monitor in place. Breathing unlabored with symmetric chest rise and fall.

## 2021-01-29 ENCOUNTER — Emergency Department
Admission: EM | Admit: 2021-01-29 | Discharge: 2021-01-29 | Disposition: A | Discharge status: Home / Self Care | Payer: Medicare Other | Attending: Emergency Medicine | Admitting: Emergency Medicine

## 2021-01-29 ENCOUNTER — Other Ambulatory Visit: Payer: Self-pay

## 2021-01-29 ENCOUNTER — Emergency Department: Payer: Medicare Other

## 2021-01-29 DIAGNOSIS — Z85828 Personal history of other malignant neoplasm of skin: Secondary | ICD-10-CM | POA: Diagnosis not present

## 2021-01-29 DIAGNOSIS — J45909 Unspecified asthma, uncomplicated: Secondary | ICD-10-CM | POA: Diagnosis not present

## 2021-01-29 DIAGNOSIS — F0390 Unspecified dementia without behavioral disturbance: Secondary | ICD-10-CM | POA: Diagnosis not present

## 2021-01-29 DIAGNOSIS — W01198A Fall on same level from slipping, tripping and stumbling with subsequent striking against other object, initial encounter: Secondary | ICD-10-CM | POA: Diagnosis not present

## 2021-01-29 DIAGNOSIS — W19XXXA Unspecified fall, initial encounter: Secondary | ICD-10-CM

## 2021-01-29 DIAGNOSIS — Z87891 Personal history of nicotine dependence: Secondary | ICD-10-CM | POA: Diagnosis not present

## 2021-01-29 DIAGNOSIS — Z96653 Presence of artificial knee joint, bilateral: Secondary | ICD-10-CM | POA: Insufficient documentation

## 2021-01-29 DIAGNOSIS — S0003XA Contusion of scalp, initial encounter: Secondary | ICD-10-CM | POA: Insufficient documentation

## 2021-01-29 DIAGNOSIS — S0990XA Unspecified injury of head, initial encounter: Secondary | ICD-10-CM | POA: Diagnosis present

## 2021-01-29 NOTE — ED Provider Notes (Signed)
[facility] Emergency Department Provider Note  ____________________________________________   Event Date/Time   First MD Initiated Contact with Patient 01/29/21 0630     (approximate)  I have reviewed the triage vital signs and the nursing notes.   HISTORY  Chief Complaint Fall  Level 5 caveat:  history/ROS limited by chronic dementia  HPI Curtis West is a 87 y.o. male with history as listed below which notably includes Alzheimer's dementia.  He comes from his facility tonight by EMS for evaluation after a fall.  He does not remember the circumstances but he said the right front part of his head hurts and he has a hematoma.  He has no other pain.  He specifically denies neck pain, chest pain, abdominal pain , nausea, vomiting, and any pain in his arms or his legs.        Past Medical History:  Diagnosis Date  . Allergy    Seasonal Allergies  . Alzheimer disease ([place])   . Arthritis   . Asthma   . GERD (gastroesophageal reflux disease)   . Hyperlipidemia   . Hypertension   . Rosacea   . Skin cancer     Patient Active Problem List   Diagnosis Date Noted  . Arthritis 12/23/2019  . Asthma without status asthmaticus 12/23/2019  . Seasonal allergies 12/23/2019  . Calculus of gallbladder without cholecystitis without obstruction 12/23/2019  . OSA (obstructive sleep apnea) 02/23/2018  . Esophageal dysphagia 02/03/2018  . Normocytic anemia 02/03/2018  . Posterior capsular opacification non visually significant of left eye 01/30/2018  . Fissure in skin of foot 08/27/2016  . Hammer toes of both feet 08/27/2016  . Onychomycosis due to dermatophyte 08/27/2016  . H/O total knee replacement, right 04/27/2015  . Primary osteoarthritis of right hip 04/26/2015  . Personal history of other malignant neoplasm of skin 08/12/2014  . Squamous cell carcinoma 04/05/2014  . Arthritis of knee 04/29/2012  . S/P knee replacement 04/29/2012  . Allergic  rhinitis due to pollen 06/18/2011  . Dyspepsia and other specified disorders of function of stomach 06/18/2011  . Mixed hyperlipidemia 06/18/2011  . Osteoarthritis of multiple joints 06/18/2011  . Rosacea 06/18/2011  . Venous (peripheral) insufficiency 06/18/2011  . Pseudophakia of right eye 10/30/2009  . Pseudophakia of left eye 09/18/2009    Past Surgical History:  Procedure Laterality Date  . REPLACEMENT TOTAL KNEE BILATERAL Bilateral   . SINUS LIFT WITH BONE GRAFT    . TONSILLECTOMY      Prior to Admission medications   Medication Sig Start Date End Date Taking? Authorizing Provider  Melatonin 5 MG CAPS Take by mouth.    [provider]  naproxen sodium (ALEVE) 220 MG tablet Take by mouth.    [provider]  rivastigmine (EXELON) 1.5 MG capsule Take by mouth. 12/16/19 01/15/20  [provider]  sulfamethoxazole-trimethoprim (BACTRIM DS,SEPTRA DS) 800-160 MG tablet Take 1 tablet by mouth 2 (two) times daily. Patient not taking: Reported on 12/23/2019 09/09/17   Little, Traci M, PA-C    Allergies Amoxicillin and Sulfa antibiotics  Family History  Problem Relation Age of Onset  . ALS Father 53    Social History Social History   Tobacco Use  . Smoking status: Former Research scientist (life sciences)  . Smokeless tobacco: Never Used  Substance Use Topics  . Alcohol use: Not Currently  . Drug use: Not Currently    Review of Systems Level 5 caveat:  history/ROS limited by chronic dementia   ____________________________________________  PHYSICAL EXAM:  VITAL SIGNS: ED Triage Vitals [01/29/21 0626]  Enc Vitals Group     BP (!) 166/94     Pulse Rate 67     Resp 18     Temp (!) 97.5 F (36.4 C)     Temp Source Oral     SpO2 100 %     Weight 90.7 kg (200 lb)     Height 1.829 m (6')     Head Circumference      Peak Flow      Pain Score 10     Pain Loc      Pain Edu?      Excl. in [place]?     Constitutional: Alert and oriented to self and location but does not  remember what happened. Eyes: Conjunctivae are normal.  Head: Contusion/hematoma on the right frontal forehead/scalp.  Tender to palpation.  No laceration.  No other head injuries are visible. Nose: No congestion/rhinnorhea. Mouth/Throat: Patient is wearing a mask. Neck: No stridor.  No meningeal signs.   Cardiovascular: Normal rate, regular rhythm. Good peripheral circulation. Respiratory: Normal respiratory effort.  No retractions. Gastrointestinal: Soft and nontender. No distention.  Musculoskeletal: No lower extremity tenderness nor edema. No gross deformities of extremities.  No pain with passive or active range of motion of his arms and his legs. Neurologic:  Normal speech and language. No gross focal neurologic deficits are appreciated.  Patient has a history of dementia and is confused about recent events and thus has a GCS of 14 but this is understandable given his chronic dementia. Skin:  Skin is warm, dry and intact.  ____________________________________________   LABS (all labs ordered are listed, but only abnormal results are displayed)  Labs Reviewed - No data to display ____________________________________________  EKG  ED ECG REPORT I, Hinda Kehr, the attending physician, personally viewed and interpreted this ECG.  Date: 01/29/2021 EKG Time: 6:20 AM Rate: 67 Rhythm: normal sinus rhythm QRS Axis: normal Intervals: normal ST/T Wave abnormalities: normal Narrative Interpretation: no evidence of acute ischemia  ____________________________________________  RADIOLOGY I, Hinda Kehr, personally viewed and evaluated these images (plain radiographs) as part of my medical decision making, as well as reviewing the written report by the radiologist.  ED MD interpretation:  No acute abnormalities on CTs  Official radiology report(s): CT Head Wo Contrast  Result Date: 01/29/2021 CLINICAL DATA:  Minor head trauma EXAM: CT HEAD WITHOUT CONTRAST CT CERVICAL SPINE  WITHOUT CONTRAST TECHNIQUE: Multidetector CT imaging of the head and cervical spine was performed following the standard protocol without intravenous contrast. Multiplanar CT image reconstructions of the cervical spine were also generated. COMPARISON:  07/31/2020 FINDINGS: CT HEAD FINDINGS Brain: No evidence of acute infarction, hemorrhage, hydrocephalus, extra-axial collection or mass lesion/mass effect. Brain atrophy especially notable in the temporal lobes. Chronic small vessel ischemia that is confluent in the deep white matter. Incidental prominent vessels traversing the septum pellucidum. Vascular: No hyperdense vessel or unexpected calcification. Skull: Anterior right scalp swelling.  No calvarial fracture. Sinuses/Orbits: No visible injury.  Bilateral cataract resection. CT CERVICAL SPINE FINDINGS Alignment: No traumatic malalignment Skull base and vertebrae: No acute fracture Soft tissues and spinal canal: No prevertebral fluid or swelling. No visible canal hematoma. Disc levels: Generalized disc and facet degeneration with C4-5 mild anterolisthesis. Multilevel foraminal narrowing. Mild spinal stenosis at C3-4. Upper chest: No visible injury IMPRESSION: 1. No evidence of acute intracranial or cervical spine injury. 2. Scalp swelling without calvarial fracture. Electronically Signed   By: Angelica Chessman  Watts M.D.   On: 01/29/2021 07:09   CT Cervical Spine Wo Contrast  Result Date: 01/29/2021 CLINICAL DATA:  Minor head trauma EXAM: CT HEAD WITHOUT CONTRAST CT CERVICAL SPINE WITHOUT CONTRAST TECHNIQUE: Multidetector CT imaging of the head and cervical spine was performed following the standard protocol without intravenous contrast. Multiplanar CT image reconstructions of the cervical spine were also generated. COMPARISON:  07/31/2020 FINDINGS: CT HEAD FINDINGS Brain: No evidence of acute infarction, hemorrhage, hydrocephalus, extra-axial collection or mass lesion/mass effect. Brain atrophy especially notable  in the temporal lobes. Chronic small vessel ischemia that is confluent in the deep white matter. Incidental prominent vessels traversing the septum pellucidum. Vascular: No hyperdense vessel or unexpected calcification. Skull: Anterior right scalp swelling.  No calvarial fracture. Sinuses/Orbits: No visible injury.  Bilateral cataract resection. CT CERVICAL SPINE FINDINGS Alignment: No traumatic malalignment Skull base and vertebrae: No acute fracture Soft tissues and spinal canal: No prevertebral fluid or swelling. No visible canal hematoma. Disc levels: Generalized disc and facet degeneration with C4-5 mild anterolisthesis. Multilevel foraminal narrowing. Mild spinal stenosis at C3-4. Upper chest: No visible injury IMPRESSION: 1. No evidence of acute intracranial or cervical spine injury. 2. Scalp swelling without calvarial fracture. Electronically Signed   By: Monte Fantasia M.D.   On: 01/29/2021 07:09    ____________________________________________   PROCEDURES   Procedure(s) performed (including Critical Care):  Procedures   ____________________________________________   INITIAL IMPRESSION / MDM / [place] / ED COURSE  As part of my medical decision making, I reviewed the following data within the [place] History obtained from family, Nursing notes reviewed and incorporated, EKG interpreted , Old chart reviewed and Notes from prior ED visits   Differential diagnosis includes, but is not limited to, contusion, intracranial bleed, skull fracture, cervical spine injury, acute infection.  The patient reportedly has had no history of recent health abnormalities and his vital signs are stable other than some mild hypertension which is likely circumstantial.  He is asymptomatic other than some localized tenderness to palpation of his forehead at the site of the contusion.  There is no indication for lab work at this time.  His EKG shows no signs of  abnormalities or ischemia.  I ordered CT head and CT cervical spine but will hold off on lab work or additional evaluation until I speak with his son.  The patient has had similar to this in the past including a very similar visit 4 months ago during which I evaluated the patient and spoke with his son who is very understanding and reasonable.  The patient is in no distress at this time.       Clinical Course as of 01/29/21 0732  Mon Jan 29, 2021  0637 I placed a call to the patient's son, Tayveon Lombardo, and left a HIPAA compliant voicemail and asked him to call me back. [CF]  [phone] I spoke by phone with Mr. Sovereign Ramiro.  We discussed his father's situation and he agrees with my plan and recommendation for CT scan of the head and cervical spine with discharge back to his facility assuming no evidence of acute injury.  He is comfortable with not checking lab work given the patient's history of falls and overall well appearance.  I updated the patient's nurse, Caryl Pina, with the plan of care. [CF]  [phone] CT scan showed no evidence of acute abnormality.  I will discharge him with his son back to his nursing facility as per the previously  described plan. [CF]    Clinical Course User Index [CF] Hinda Kehr, MD     ____________________________________________  FINAL CLINICAL IMPRESSION(S) / ED DIAGNOSES  Final diagnoses:  Fall, initial encounter  Hematoma of scalp, initial encounter  Contusion of scalp, initial encounter     MEDICATIONS GIVEN DURING THIS VISIT:  Medications - No data to display   ED Discharge Orders    None      *Please note:  Marguis Mathieson was evaluated in Emergency Department on 01/29/2021 for the symptoms described in the history of present illness. He was evaluated in the context of the global COVID-19 pandemic, which necessitated consideration that the patient might be at risk for infection with the SARS-CoV-2 virus that causes COVID-19. Institutional  protocols and algorithms that pertain to the evaluation of patients at risk for COVID-19 are in a state of rapid change based on information released by regulatory bodies including the CDC and federal and state organizations. These policies and algorithms were followed during the patient's care in the ED.  Some ED evaluations and interventions may be delayed as a result of limited staffing during and after the pandemic.*  Note:  This document was prepared using Dragon voice recognition software and may include unintentional dictation errors.   Hinda Kehr, MD 01/29/21 [phone]

## 2021-01-29 NOTE — Discharge Instructions (Signed)

## 2021-01-29 NOTE — ED Triage Notes (Addendum)
Pt presents to the ED via EMS from [place]. Pt states he does not remember how and why he feel he just remembers hitting his head. Pt states, "I just saw the floor coming at he." When asked if he got dizzy, "probably but I just  Golf-ball sized hematoma noted to the R sided of pt's forehead. Pt is A&Ox4 and NAD. Pt c/o of a headache. Denies other injuries. Denies blood thinner use.

## 2021-09-14 ENCOUNTER — Encounter: Payer: Self-pay | Admitting: General Surgery

## 2022-05-27 ENCOUNTER — Telehealth (INDEPENDENT_AMBULATORY_CARE_PROVIDER_SITE_OTHER): Payer: Self-pay

## 2022-05-27 NOTE — Telephone Encounter (Signed)
Called and spoke to nurse at [facility] in regards to pt's referral. I requested and received the US DVT result. Per Dr. Delana Meyer, we will need to repeat study.. However, he wants to go ahead and bring him in this Thursday for a consult with him. Pt is + DVT and has started Eliquis. Transportation from [facility] will be calling to schedule appt.

## 2022-06-02 DIAGNOSIS — I82409 Acute embolism and thrombosis of unspecified deep veins of unspecified lower extremity: Secondary | ICD-10-CM | POA: Insufficient documentation

## 2022-06-03 ENCOUNTER — Ambulatory Visit (INDEPENDENT_AMBULATORY_CARE_PROVIDER_SITE_OTHER): Payer: Medicare Other | Admitting: Vascular Surgery

## 2022-06-03 ENCOUNTER — Encounter (INDEPENDENT_AMBULATORY_CARE_PROVIDER_SITE_OTHER): Payer: Self-pay | Admitting: Vascular Surgery

## 2022-06-03 VITALS — BP 145/84 | HR 71 | Resp 17

## 2022-06-03 DIAGNOSIS — I872 Venous insufficiency (chronic) (peripheral): Secondary | ICD-10-CM | POA: Diagnosis not present

## 2022-06-03 DIAGNOSIS — M1990 Unspecified osteoarthritis, unspecified site: Secondary | ICD-10-CM

## 2022-06-03 DIAGNOSIS — E782 Mixed hyperlipidemia: Secondary | ICD-10-CM

## 2022-06-03 DIAGNOSIS — I824Z9 Acute embolism and thrombosis of unspecified deep veins of unspecified distal lower extremity: Secondary | ICD-10-CM

## 2022-06-25 IMAGING — CT CT CERVICAL SPINE W/O CM
3 of 4 series · 10 of 33 positions shown, 11 images · non-contrast
Comparison: None.

CLINICAL DATA: Fall

EXAM:
CT HEAD WITHOUT CONTRAST
CT CERVICAL SPINE WITHOUT CONTRAST
TECHNIQUE: Multidetector CT imaging of the head and cervical spine was
performed following the standard protocol without intravenous
contrast. Multiplanar CT image reconstructions of the cervical spine
were also generated.

[Series 6: sagittal bone · sagittal · 0.23mm/px · 5 of 58 slices shown]
[im 20/58  bone]
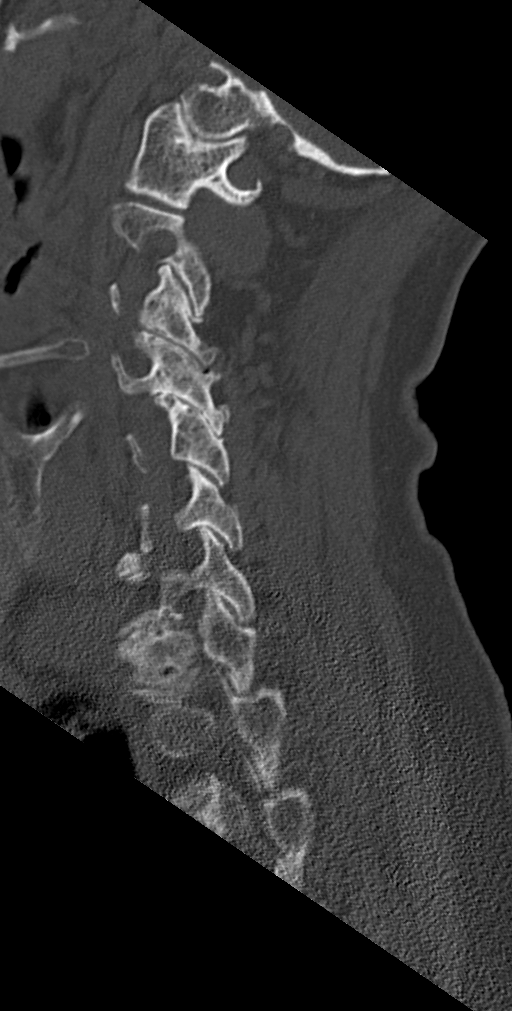
[im 24/58  bone]
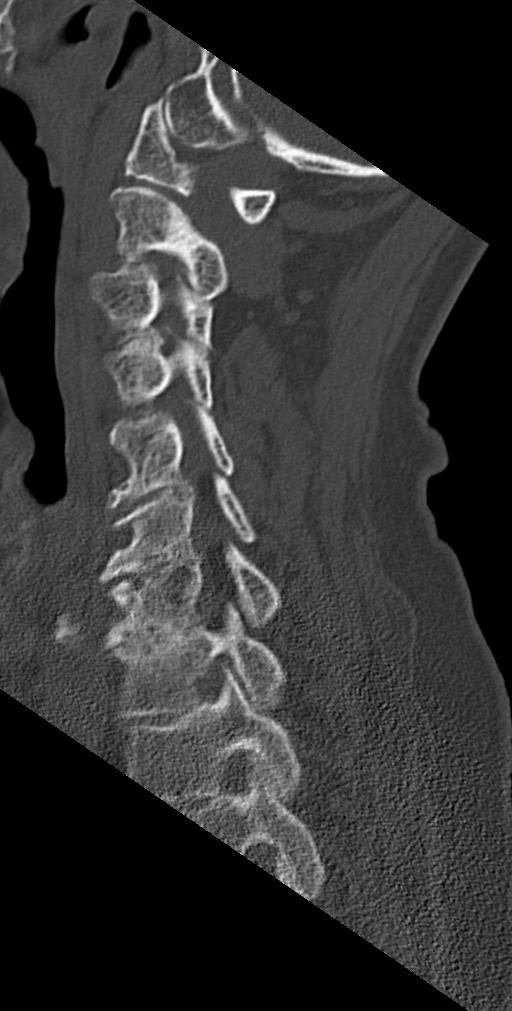
[im 29/58  bone]
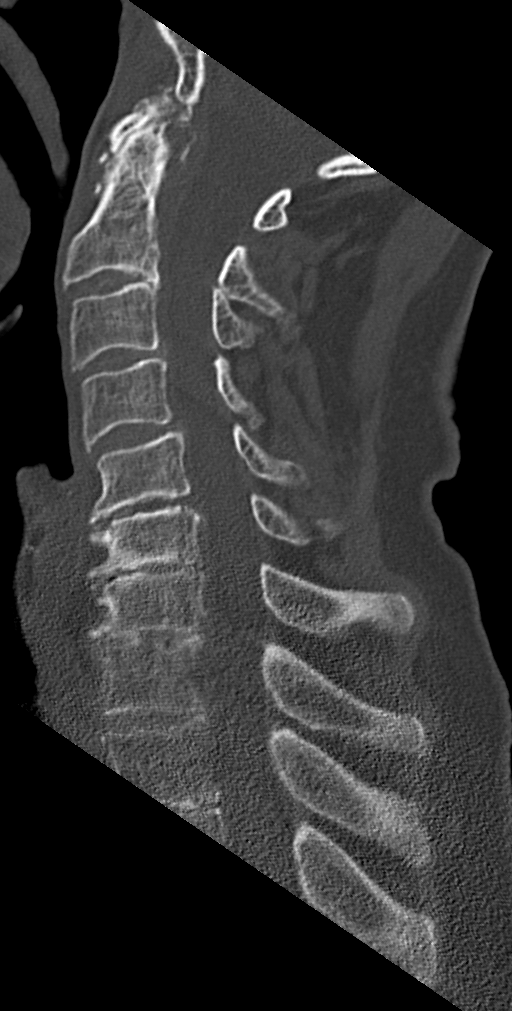
[im 34/58  bone]
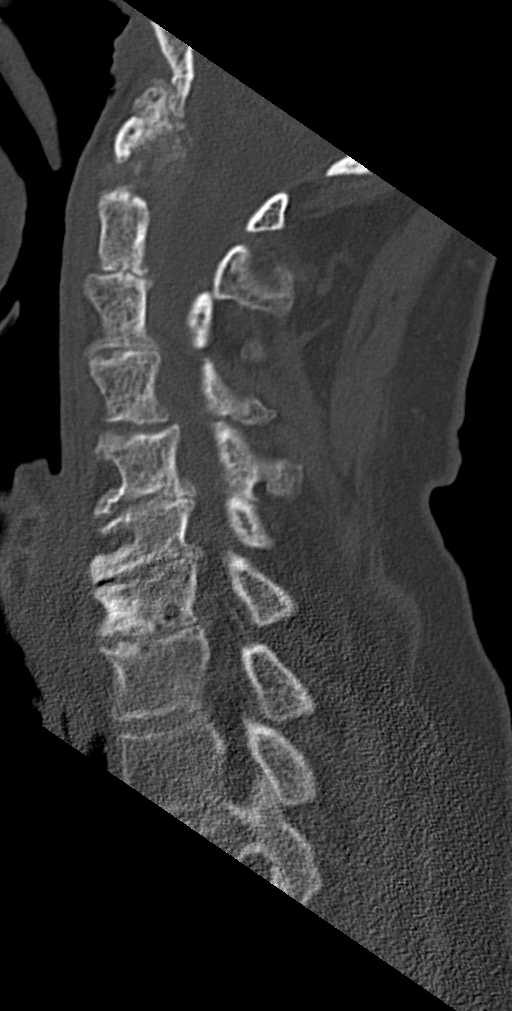
[im 39/58  bone]
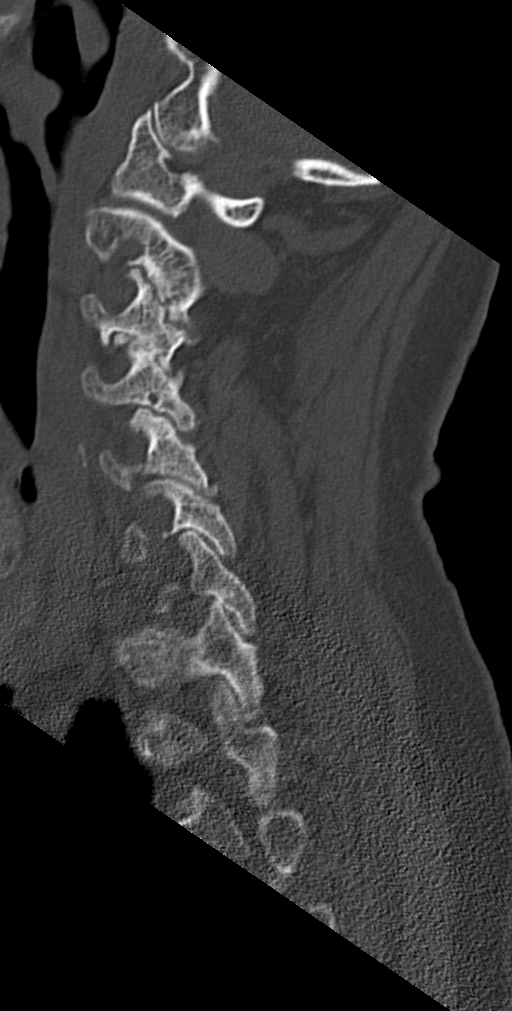

[Series 7: coronal bone · coronal · 0.22mm/px · 3 of 60 slices shown]
[im 17/60  bone]
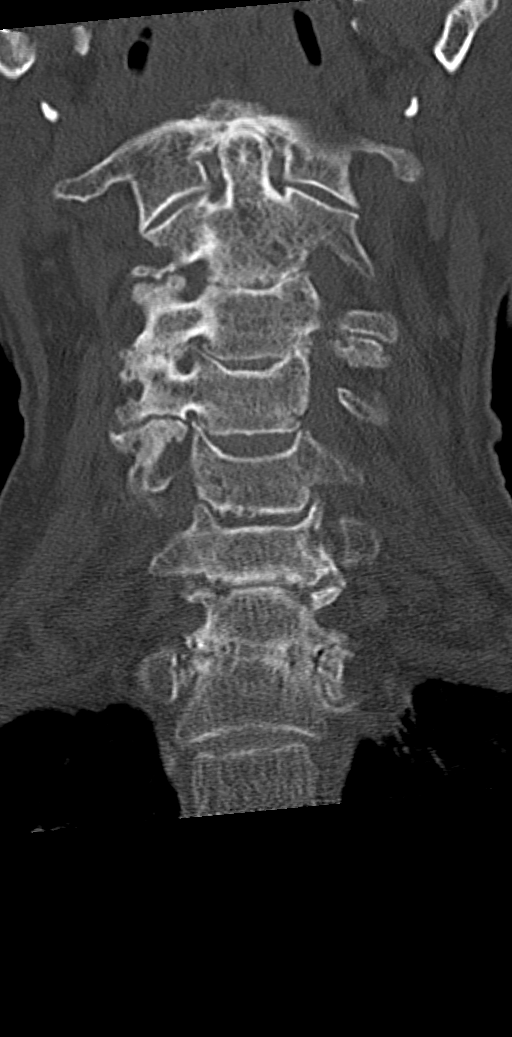
[im 26/60  bone]
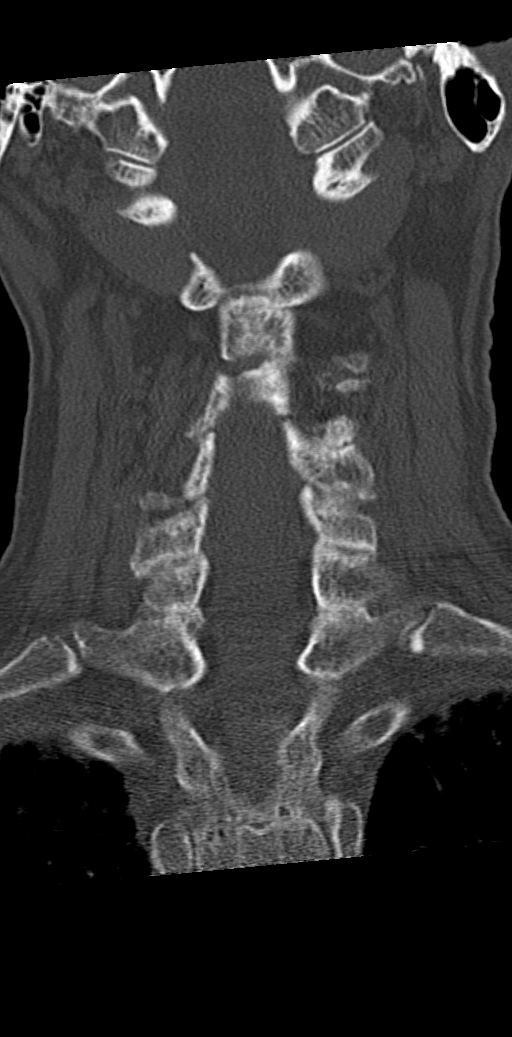
[im 34/60  bone]
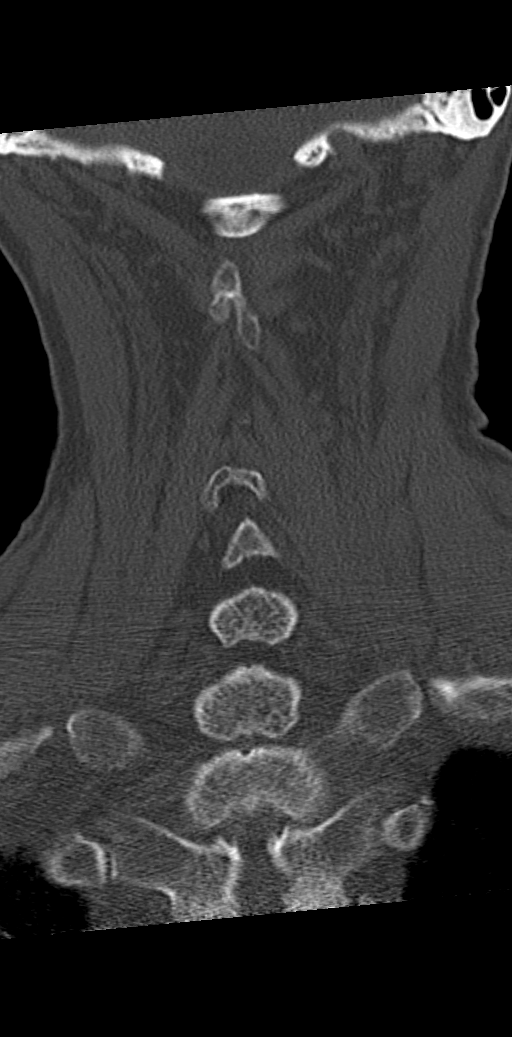

[Series 8: orthogonal bone · axial · 0.22mm/px · z∈[-255,-160]mm · 2 of 117 slices shown, 3 images]
[im 30/117  soft-tissue]
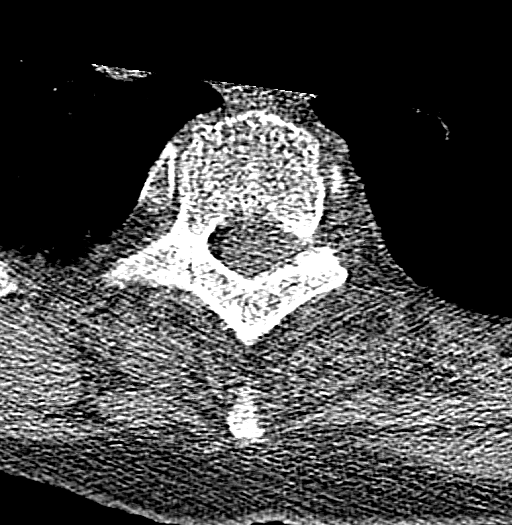
[im 30/117  bone]
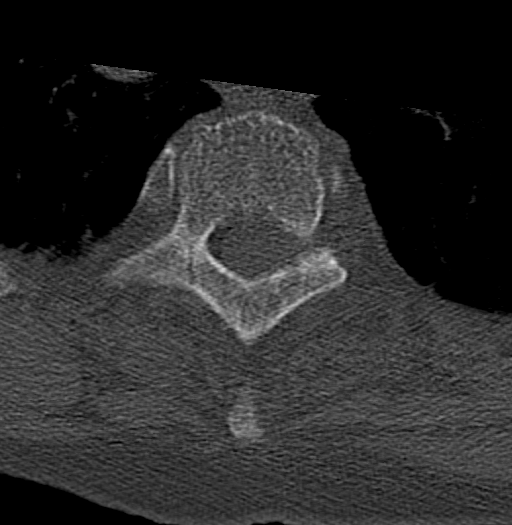
[im 88/117  bone]
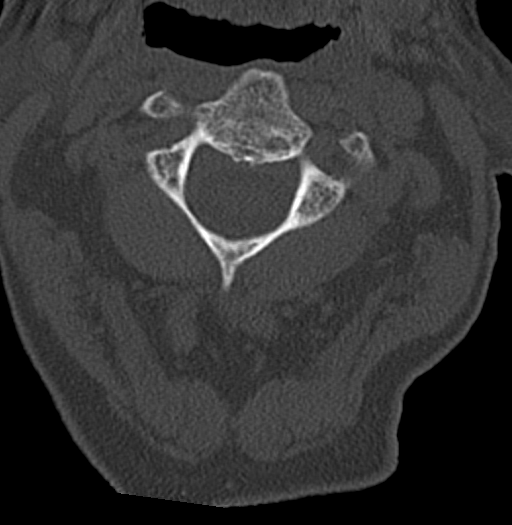

[10 of 33 positions shown; findings below may reference images not displayed]

FINDINGS: CT HEAD FINDINGS

Brain: There is no mass, hemorrhage or extra-axial collection. There
is generalized atrophy without lobar predilection. There is
hypoattenuation of the periventricular white matter, most commonly
indicating chronic ischemic microangiopathy.

Vascular: No abnormal hyperdensity of the major intracranial
arteries or dural venous sinuses. No intracranial atherosclerosis.

Skull: The visualized skull base, calvarium and extracranial soft
tissues are normal.

Sinuses/Orbits: No fluid levels or advanced mucosal thickening of
the visualized paranasal sinuses. No mastoid or middle ear effusion.
The orbits are normal.

CT CERVICAL SPINE FINDINGS

Alignment: No static subluxation. Facets are aligned. Occipital
condyles are normally positioned.

Skull base and vertebrae: No acute fracture.

Soft tissues and spinal canal: No prevertebral fluid or swelling. No
visible canal hematoma.

Disc levels: Multilevel severe facet arthrosis. Mild degenerative
disc disease. No spinal canal stenosis.

Upper chest: No pneumothorax, pulmonary nodule or pleural effusion.

Other: Normal visualized paraspinal cervical soft tissues.
IMPRESSION: 1. Chronic ischemic microangiopathy and generalized atrophy without
acute intracranial abnormality.
2. No acute fracture or static subluxation of the cervical spine.

## 2022-09-01 NOTE — Progress Notes (Deleted)
MRN : [id number]  Curtis West is a 88 y.o. (02-16-1932) male who presents with chief complaint of legs hurt and swell.  History of Present Illness:  The patient presents to the office for evaluation of DVT.  DVT was identified at  by Duplex ultrasound last week after the patient c/o left foot pain.  No history of trauma or injury.  Duplex scan showed DVT in a posterior tibial vein and he was started on Eliquis.   Today the patient denies that the leg/ankle is painful and is not swelling much.     The patient has not been using compression therapy at this point.   No SOB or pleuritic chest pains.  No cough or hemoptysis.   No blood per rectum or blood in any sputum.  No excessive bruising per the patient.    The patient denies amaurosis fugax or recent TIA symptoms. There are no recent neurological changes noted. No recent episodes of angina or shortness of breath documented.    No outpatient medications have been marked as taking for the 09/02/22 encounter (Appointment) with Delana Meyer, Dolores Lory, MD.    Past Medical History:  Diagnosis Date   Allergy    Seasonal Allergies   Alzheimer disease ([place])    Arthritis    Asthma    GERD (gastroesophageal reflux disease)    Hyperlipidemia    Hypertension    Rosacea    Skin cancer     Past Surgical History:  Procedure Laterality Date   REPLACEMENT TOTAL KNEE BILATERAL Bilateral    SINUS LIFT WITH BONE GRAFT     TONSILLECTOMY      Social History Social History   Tobacco Use   Smoking status: Former   Smokeless tobacco: Never  Substance Use Topics   Alcohol use: Not Currently   Drug use: Not Currently    Family History Family History  Problem Relation Age of Onset   ALS Father 68    Allergies  Allergen Reactions   Amoxicillin    Sulfa Antibiotics     "I get loopy"     REVIEW OF SYSTEMS (Negative unless checked)  Constitutional: '[]'$ Weight loss  '[]'$ Fever  '[]'$ Chills Cardiac: '[]'$ Chest pain   '[]'$ Chest  pressure   '[]'$ Palpitations   '[]'$ Shortness of breath when laying flat   '[]'$ Shortness of breath with exertion. Vascular:  '[]'$ Pain in legs with walking   '[x]'$ Pain in legs at rest  '[]'$ History of DVT   '[]'$ Phlebitis   '[x]'$ Swelling in legs   '[]'$ Varicose veins   '[]'$ Non-healing ulcers Pulmonary:   '[]'$ Uses home oxygen   '[]'$ Productive cough   '[]'$ Hemoptysis   '[]'$ Wheeze  '[]'$ COPD   '[]'$ Asthma Neurologic:  '[]'$ Dizziness   '[]'$ Seizures   '[]'$ History of stroke   '[]'$ History of TIA  '[]'$ Aphasia   '[]'$ Vissual changes   '[]'$ Weakness or numbness in arm   '[]'$ Weakness or numbness in leg Musculoskeletal:   '[]'$ Joint swelling   '[]'$ Joint pain   '[]'$ Low back pain Hematologic:  '[]'$ Easy bruising  '[]'$ Easy bleeding   '[]'$ Hypercoagulable state   '[]'$ Anemic Gastrointestinal:  '[]'$ Diarrhea   '[]'$ Vomiting  '[]'$ Gastroesophageal reflux/heartburn   '[]'$ Difficulty swallowing. Genitourinary:  '[]'$ Chronic kidney disease   '[]'$ Difficult urination  '[]'$ Frequent urination   '[]'$ Blood in urine Skin:  '[]'$ Rashes   '[]'$ Ulcers  Psychological:  '[]'$ History of anxiety   '[]'$  History of major depression.  Physical Examination  There were no vitals filed for this visit. There is no height or weight on file to calculate BMI.  Gen: WD/WN, NAD Head: [place]/AT, No temporalis wasting.  Ear/Nose/Throat: Hearing grossly intact, nares w/o erythema or drainage, pinna without lesions Eyes: PER, EOMI, sclera nonicteric.  Neck: Supple, no gross masses.  No JVD.  Pulmonary:  Good air movement, no audible wheezing, no use of accessory muscles.  Cardiac: RRR, precordium not hyperdynamic. Vascular:  scattered varicosities present bilaterally.  Moderate venous stasis changes to the legs bilaterally.  2+ soft pitting edema  Vessel Right Left  Radial Palpable Palpable  Gastrointestinal: soft, non-distended. No guarding/no peritoneal signs.  Musculoskeletal: M/S 5/5 throughout.  No deformity.  Neurologic: CN 2-12 intact. Pain and light touch intact in extremities.  Symmetrical.  Speech is fluent. Motor exam as listed  above. Psychiatric: Judgment intact, Mood & affect appropriate for pt's clinical situation. Dermatologic: Venous rashes no ulcers noted.  No changes consistent with cellulitis. Lymph : No lichenification or skin changes of chronic lymphedema.  CBC Lab Results  Component Value Date   WBC 11.4 (H) 12/15/2019   HGB 12.5 (L) 12/15/2019   HCT 38.6 (L) 12/15/2019   MCV 84.8 12/15/2019   PLT 275 12/15/2019    BMET    Component Value Date/Time   NA 130 (L) 12/15/2019 1551   K 3.5 12/15/2019 1551   CL 97 (L) 12/15/2019 1551   CO2 24 12/15/2019 1551   GLUCOSE 131 (H) 12/15/2019 1551   BUN 20 12/15/2019 1551   CREATININE 1.00 12/15/2019 1551   CALCIUM 8.5 (L) 12/15/2019 1551   GFRNONAA >60 12/15/2019 1551   GFRAA >60 12/15/2019 1551   CrCl cannot be calculated (Patient's most recent lab result is older than the maximum 21 days allowed.).  COAG No results found for: "INR", "PROTIME"  Radiology No results found.   Assessment/Plan There are no diagnoses linked to this encounter.   Hortencia Pilar, MD  09/01/2022 8:56 PM

## 2022-09-02 ENCOUNTER — Encounter (INDEPENDENT_AMBULATORY_CARE_PROVIDER_SITE_OTHER): Payer: Medicare Other

## 2022-09-02 ENCOUNTER — Ambulatory Visit (INDEPENDENT_AMBULATORY_CARE_PROVIDER_SITE_OTHER): Payer: Medicare Other | Admitting: Vascular Surgery

## 2022-09-02 DIAGNOSIS — E782 Mixed hyperlipidemia: Secondary | ICD-10-CM

## 2022-09-02 DIAGNOSIS — I872 Venous insufficiency (chronic) (peripheral): Secondary | ICD-10-CM

## 2022-09-02 DIAGNOSIS — M1990 Unspecified osteoarthritis, unspecified site: Secondary | ICD-10-CM

## 2022-09-02 DIAGNOSIS — I824Z9 Acute embolism and thrombosis of unspecified deep veins of unspecified distal lower extremity: Secondary | ICD-10-CM

## 2022-10-07 ENCOUNTER — Encounter (INDEPENDENT_AMBULATORY_CARE_PROVIDER_SITE_OTHER): Payer: Self-pay

## 2022-12-09 DIAGNOSIS — I4891 Unspecified atrial fibrillation: Secondary | ICD-10-CM

## 2022-12-09 DIAGNOSIS — I9789 Other postprocedural complications and disorders of the circulatory system, not elsewhere classified: Secondary | ICD-10-CM

## 2022-12-09 HISTORY — DX: Unspecified atrial fibrillation: I97.89

## 2022-12-09 HISTORY — DX: Unspecified atrial fibrillation: I48.91

## 2022-12-20 ENCOUNTER — Emergency Department: Payer: Medicare Other

## 2022-12-20 ENCOUNTER — Encounter: Payer: Self-pay | Admitting: Internal Medicine

## 2022-12-20 ENCOUNTER — Inpatient Hospital Stay
Admission: EM | Admit: 2022-12-20 | Discharge: 2022-12-26 | DRG: 481 | Disposition: A | Discharge status: Skilled Nursing Facility | Payer: Medicare Other | Attending: Internal Medicine | Admitting: Internal Medicine

## 2022-12-20 ENCOUNTER — Other Ambulatory Visit: Payer: Self-pay

## 2022-12-20 DIAGNOSIS — Z96653 Presence of artificial knee joint, bilateral: Secondary | ICD-10-CM | POA: Diagnosis present

## 2022-12-20 DIAGNOSIS — F32A Depression, unspecified: Secondary | ICD-10-CM | POA: Diagnosis present

## 2022-12-20 DIAGNOSIS — Y838 Other surgical procedures as the cause of abnormal reaction of the patient, or of later complication, without mention of misadventure at the time of the procedure: Secondary | ICD-10-CM | POA: Diagnosis not present

## 2022-12-20 DIAGNOSIS — W1830XA Fall on same level, unspecified, initial encounter: Secondary | ICD-10-CM | POA: Diagnosis present

## 2022-12-20 DIAGNOSIS — I959 Hypotension, unspecified: Secondary | ICD-10-CM | POA: Diagnosis not present

## 2022-12-20 DIAGNOSIS — Z882 Allergy status to sulfonamides status: Secondary | ICD-10-CM

## 2022-12-20 DIAGNOSIS — E440 Moderate protein-calorie malnutrition: Secondary | ICD-10-CM | POA: Diagnosis not present

## 2022-12-20 DIAGNOSIS — S72141A Displaced intertrochanteric fracture of right femur, initial encounter for closed fracture: Secondary | ICD-10-CM | POA: Diagnosis present

## 2022-12-20 DIAGNOSIS — Z79899 Other long term (current) drug therapy: Secondary | ICD-10-CM

## 2022-12-20 DIAGNOSIS — R54 Age-related physical debility: Secondary | ICD-10-CM | POA: Diagnosis present

## 2022-12-20 DIAGNOSIS — F0283 Dementia in other diseases classified elsewhere, unspecified severity, with mood disturbance: Secondary | ICD-10-CM | POA: Diagnosis present

## 2022-12-20 DIAGNOSIS — Y92009 Unspecified place in unspecified non-institutional (private) residence as the place of occurrence of the external cause: Secondary | ICD-10-CM

## 2022-12-20 DIAGNOSIS — R509 Fever, unspecified: Secondary | ICD-10-CM | POA: Diagnosis not present

## 2022-12-20 DIAGNOSIS — R110 Nausea: Secondary | ICD-10-CM | POA: Diagnosis present

## 2022-12-20 DIAGNOSIS — E785 Hyperlipidemia, unspecified: Secondary | ICD-10-CM | POA: Diagnosis present

## 2022-12-20 DIAGNOSIS — Z85828 Personal history of other malignant neoplasm of skin: Secondary | ICD-10-CM

## 2022-12-20 DIAGNOSIS — D649 Anemia, unspecified: Secondary | ICD-10-CM

## 2022-12-20 DIAGNOSIS — K219 Gastro-esophageal reflux disease without esophagitis: Secondary | ICD-10-CM | POA: Diagnosis present

## 2022-12-20 DIAGNOSIS — I97191 Other postprocedural cardiac functional disturbances following other surgery: Secondary | ICD-10-CM | POA: Diagnosis not present

## 2022-12-20 DIAGNOSIS — I10 Essential (primary) hypertension: Secondary | ICD-10-CM | POA: Diagnosis present

## 2022-12-20 DIAGNOSIS — Z87891 Personal history of nicotine dependence: Secondary | ICD-10-CM

## 2022-12-20 DIAGNOSIS — I4891 Unspecified atrial fibrillation: Secondary | ICD-10-CM | POA: Diagnosis not present

## 2022-12-20 DIAGNOSIS — Y92129 Unspecified place in nursing home as the place of occurrence of the external cause: Secondary | ICD-10-CM

## 2022-12-20 DIAGNOSIS — R300 Dysuria: Secondary | ICD-10-CM | POA: Diagnosis present

## 2022-12-20 DIAGNOSIS — G309 Alzheimer's disease, unspecified: Secondary | ICD-10-CM | POA: Diagnosis present

## 2022-12-20 DIAGNOSIS — J45909 Unspecified asthma, uncomplicated: Secondary | ICD-10-CM | POA: Diagnosis present

## 2022-12-20 DIAGNOSIS — W19XXXA Unspecified fall, initial encounter: Secondary | ICD-10-CM | POA: Diagnosis not present

## 2022-12-20 DIAGNOSIS — Z7901 Long term (current) use of anticoagulants: Secondary | ICD-10-CM | POA: Diagnosis not present

## 2022-12-20 DIAGNOSIS — Z880 Allergy status to penicillin: Secondary | ICD-10-CM

## 2022-12-20 DIAGNOSIS — J4521 Mild intermittent asthma with (acute) exacerbation: Secondary | ICD-10-CM

## 2022-12-20 DIAGNOSIS — R000 Tachycardia, unspecified: Secondary | ICD-10-CM | POA: Diagnosis not present

## 2022-12-20 DIAGNOSIS — E871 Hypo-osmolality and hyponatremia: Secondary | ICD-10-CM | POA: Diagnosis not present

## 2022-12-20 DIAGNOSIS — Z66 Do not resuscitate: Secondary | ICD-10-CM | POA: Diagnosis present

## 2022-12-20 DIAGNOSIS — S72001A Fracture of unspecified part of neck of right femur, initial encounter for closed fracture: Principal | ICD-10-CM

## 2022-12-20 DIAGNOSIS — D72829 Elevated white blood cell count, unspecified: Secondary | ICD-10-CM | POA: Diagnosis present

## 2022-12-20 DIAGNOSIS — F0284 Dementia in other diseases classified elsewhere, unspecified severity, with anxiety: Secondary | ICD-10-CM | POA: Diagnosis present

## 2022-12-20 DIAGNOSIS — Z86718 Personal history of other venous thrombosis and embolism: Secondary | ICD-10-CM

## 2022-12-20 DIAGNOSIS — F418 Other specified anxiety disorders: Secondary | ICD-10-CM

## 2022-12-20 DIAGNOSIS — F0390 Unspecified dementia without behavioral disturbance: Secondary | ICD-10-CM | POA: Insufficient documentation

## 2022-12-20 DIAGNOSIS — D62 Acute posthemorrhagic anemia: Secondary | ICD-10-CM | POA: Diagnosis not present

## 2022-12-20 DIAGNOSIS — Z820 Family history of epilepsy and other diseases of the nervous system: Secondary | ICD-10-CM

## 2022-12-20 DIAGNOSIS — Z6825 Body mass index (BMI) 25.0-25.9, adult: Secondary | ICD-10-CM

## 2022-12-20 DIAGNOSIS — I82409 Acute embolism and thrombosis of unspecified deep veins of unspecified lower extremity: Secondary | ICD-10-CM | POA: Diagnosis present

## 2022-12-20 LAB — CBC WITH DIFFERENTIAL/PLATELET
Abs Immature Granulocytes: 0.02 10*3/uL (ref 0.00–0.07)
Basophils Absolute: 0.1 10*3/uL (ref 0.0–0.1)
Basophils Relative: 1 %
Eosinophils Absolute: 0.5 10*3/uL (ref 0.0–0.5)
Eosinophils Relative: 8 %
HCT: 30.2 % — ABNORMAL LOW (ref 39.0–52.0)
Hemoglobin: 9.1 g/dL — ABNORMAL LOW (ref 13.0–17.0)
Immature Granulocytes: 0 %
Lymphocytes Relative: 24 %
Lymphs Abs: 1.6 10*3/uL (ref 0.7–4.0)
MCH: 24.8 pg — ABNORMAL LOW (ref 26.0–34.0)
MCHC: 30.1 g/dL (ref 30.0–36.0)
MCV: 82.3 fL (ref 80.0–100.0)
Monocytes Absolute: 0.7 10*3/uL (ref 0.1–1.0)
Monocytes Relative: 10 %
Neutro Abs: 3.6 10*3/uL (ref 1.7–7.7)
Neutrophils Relative %: 57 %
Platelets: 260 10*3/uL (ref 150–400)
RBC: 3.67 MIL/uL — ABNORMAL LOW (ref 4.22–5.81)
RDW: 14.2 % (ref 11.5–15.5)
WBC: 6.4 10*3/uL (ref 4.0–10.5)
nRBC: 0 % (ref 0.0–0.2)

## 2022-12-20 LAB — BASIC METABOLIC PANEL
Anion gap: 8 (ref 5–15)
BUN: 15 mg/dL (ref 8–23)
CO2: 23 mmol/L (ref 22–32)
Calcium: 8.2 mg/dL — ABNORMAL LOW (ref 8.9–10.3)
Chloride: 104 mmol/L (ref 98–111)
Creatinine, Ser: 1.1 mg/dL (ref 0.61–1.24)
GFR, Estimated: >60 mL/min (ref >60)
Glucose, Bld: 131 mg/dL — ABNORMAL HIGH (ref 70–99)
Potassium: 4.1 mmol/L (ref 3.5–5.1)
Sodium: 135 mmol/L (ref 135–145)

## 2022-12-20 LAB — PROTIME-INR
INR: 1.2 (ref 0.8–1.2)
Prothrombin Time: 14.9 seconds (ref 11.4–15.2)

## 2022-12-20 LAB — CBC
HCT: 27.1 % — ABNORMAL LOW (ref 39.0–52.0)
Hemoglobin: 8.2 g/dL — ABNORMAL LOW (ref 13.0–17.0)
MCH: 25.1 pg — ABNORMAL LOW (ref 26.0–34.0)
MCHC: 30.3 g/dL (ref 30.0–36.0)
MCV: 82.9 fL (ref 80.0–100.0)
Platelets: 255 10*3/uL (ref 150–400)
RBC: 3.27 MIL/uL — ABNORMAL LOW (ref 4.22–5.81)
RDW: 14.4 % (ref 11.5–15.5)
WBC: 11.9 10*3/uL — ABNORMAL HIGH (ref 4.0–10.5)
nRBC: 0 % (ref 0.0–0.2)

## 2022-12-20 LAB — APTT: aPTT: 27 seconds (ref 24–36)

## 2022-12-20 MED ORDER — LIDOCAINE 5 % EX PTCH
1.0000 | MEDICATED_PATCH | CUTANEOUS | Status: DC
  Administered 2022-12-23 – 2022-12-26 (×3): 1 via TRANSDERMAL
  Filled 2022-12-20 (×3): qty 1

## 2022-12-20 MED ORDER — METHOCARBAMOL 500 MG PO TABS: 500.0000 mg | ORAL_TABLET | Freq: Three times a day (TID) | ORAL | Status: DC | PRN

## 2022-12-20 MED ORDER — MELATONIN 5 MG PO TABS
5.0000 mg | ORAL_TABLET | Freq: Every day | ORAL | Status: DC
  Administered 2022-12-20 – 2022-12-25 (×6): 5 mg via ORAL
  Filled 2022-12-20 (×6): qty 1

## 2022-12-20 MED ORDER — MORPHINE SULFATE (PF) 2 MG/ML IV SOLN
1.0000 mg | INTRAVENOUS | Status: DC | PRN
  Administered 2022-12-20 – 2022-12-21 (×3): 1 mg via INTRAVENOUS
  Filled 2022-12-20 (×3): qty 1

## 2022-12-20 MED ORDER — QUETIAPINE FUMARATE 25 MG PO TABS
50.0000 mg | ORAL_TABLET | Freq: Every day | ORAL | Status: DC
  Administered 2022-12-20 – 2022-12-25 (×6): 50 mg via ORAL
  Filled 2022-12-20 (×6): qty 2

## 2022-12-20 MED ORDER — HALOPERIDOL LACTATE 5 MG/ML IJ SOLN
1.0000 mg | Freq: Once | INTRAMUSCULAR | Status: AC
  Administered 2022-12-20: 1 mg via INTRAVENOUS
  Filled 2022-12-20: qty 1

## 2022-12-20 MED ORDER — ACETAMINOPHEN 325 MG PO TABS: 650.0000 mg | ORAL_TABLET | Freq: Four times a day (QID) | ORAL | Status: DC | PRN

## 2022-12-20 MED ORDER — HYDRALAZINE HCL 20 MG/ML IJ SOLN: 5.0000 mg | INTRAMUSCULAR | Status: DC | PRN

## 2022-12-20 MED ORDER — HEPARIN (PORCINE) 25000 UT/250ML-% IV SOLN
1400.0000 [IU]/h | INTRAVENOUS | Status: DC
  Administered 2022-12-20: 1400 [IU]/h via INTRAVENOUS
  Filled 2022-12-20: qty 250

## 2022-12-20 MED ORDER — SENNOSIDES-DOCUSATE SODIUM 8.6-50 MG PO TABS
1.0000 | ORAL_TABLET | Freq: Every evening | ORAL | Status: DC | PRN
  Administered 2022-12-21: 1 via ORAL
  Filled 2022-12-20: qty 1

## 2022-12-20 MED ORDER — SERTRALINE HCL 50 MG PO TABS
25.0000 mg | ORAL_TABLET | Freq: Every day | ORAL | Status: DC
  Administered 2022-12-22 – 2022-12-26 (×5): 25 mg via ORAL
  Filled 2022-12-20 (×5): qty 1

## 2022-12-20 MED ORDER — SODIUM CHLORIDE 0.9 % IV SOLN: INTRAVENOUS | Status: DC

## 2022-12-20 MED ORDER — ONDANSETRON HCL 4 MG/2ML IJ SOLN: 4.0000 mg | Freq: Three times a day (TID) | INTRAMUSCULAR | Status: DC | PRN

## 2022-12-20 MED ORDER — LORAZEPAM 0.5 MG PO TABS
0.5000 mg | ORAL_TABLET | Freq: Two times a day (BID) | ORAL | Status: DC | PRN
  Filled 2022-12-20: qty 1

## 2022-12-20 MED ORDER — OXYCODONE-ACETAMINOPHEN 5-325 MG PO TABS
1.0000 | ORAL_TABLET | ORAL | Status: DC | PRN
  Administered 2022-12-20: 1 via ORAL
  Filled 2022-12-20: qty 1

## 2022-12-20 MED ORDER — LACTATED RINGERS IV BOLUS
1000.0000 mL | Freq: Once | INTRAVENOUS | Status: AC
  Administered 2022-12-20: 1000 mL via INTRAVENOUS

## 2022-12-20 MED ORDER — CEFAZOLIN SODIUM-DEXTROSE 2-4 GM/100ML-% IV SOLN
2.0000 g | INTRAVENOUS | Status: AC
  Administered 2022-12-21: 2 g via INTRAVENOUS

## 2022-12-20 MED ORDER — FLUTICASONE PROPIONATE 50 MCG/ACT NA SUSP
1.0000 | Freq: Two times a day (BID) | NASAL | Status: DC
  Administered 2022-12-21 – 2022-12-26 (×10): 1 via NASAL
  Filled 2022-12-20 (×2): qty 16

## 2022-12-20 MED ORDER — SODIUM CHLORIDE 0.9 % IV BOLUS
1000.0000 mL | Freq: Once | INTRAVENOUS | Status: AC
  Administered 2022-12-20: 1000 mL via INTRAVENOUS

## 2022-12-20 MED ORDER — ONDANSETRON HCL 4 MG/2ML IJ SOLN
4.0000 mg | Freq: Once | INTRAMUSCULAR | Status: AC
  Administered 2022-12-20: 4 mg via INTRAVENOUS
  Filled 2022-12-20: qty 2

## 2022-12-20 MED ORDER — HYDROMORPHONE HCL 1 MG/ML IJ SOLN
0.5000 mg | Freq: Once | INTRAMUSCULAR | Status: AC
  Administered 2022-12-20: 0.5 mg via INTRAVENOUS
  Filled 2022-12-20: qty 0.5

## 2022-12-20 NOTE — ED Provider Notes (Signed)
[facility] Provider Note    Event Date/Time   First MD Initiated Contact with Patient 12/20/22 [phone]     (approximate)   History   Chief Complaint Hip Pain   HPI  Curtis West is a 89 y.o. male with past medical history of hypertension, hyperlipidemia, arthritis, DVT on Eliquis, and Alzheimer's dementia who presents to the ED complaining of hip pain.  History is limited due to patient's dementia and majority of history is obtained from EMS.  EMS reports that patient had an unwitnessed fall this morning at his memory care facility and has been complaining of severe pain in his right hip since then.  Patient continues to complain of severe pain in the right hip, not alleviated by 75 mcg of IV fentanyl given by EMS.  Patient is unsure whether he hit his head, does not think he lost consciousness.  He denies pain anywhere other than his right hip.     Physical Exam   Triage Vital Signs: ED Triage Vitals  Enc Vitals Group     BP      Pulse      Resp      Temp      Temp src      SpO2      Weight      Height      Head Circumference      Peak Flow      Pain Score      Pain Loc      Pain Edu?      Excl. in [place]?     Most recent vital signs: Vitals:   12/20/22 0937 12/20/22 0941  BP: (!) 98/48 (!) 98/48  Pulse: 71 71  Resp:  16  Temp:  98.1 F (36.7 C)  SpO2:  100%    Constitutional: Alert and oriented to person and place, but not time or situation. Eyes: Conjunctivae are normal. Head: Atraumatic. Nose: No congestion/rhinnorhea. Mouth/Throat: Mucous membranes are moist.  Neck: No midline cervical spine tenderness to palpation. Cardiovascular: Normal rate, regular rhythm. Grossly normal heart sounds.  2+ radial and DP pulses bilaterally. Respiratory: Normal respiratory effort.  No retractions. Lungs CTAB.  No chest wall tenderness to palpation. Gastrointestinal: Soft and nontender. No distention. Musculoskeletal: Diffuse tenderness to  palpation at right hip, which is held in flexion.  No tenderness to palpation noted at left hip, bilateral knees or ankles.  No upper extremity bony tenderness to palpation. Neurologic:  Normal speech and language. No gross focal neurologic deficits are appreciated.    ED Results / Procedures / Treatments   Labs (all labs ordered are listed, but only abnormal results are displayed) Labs Reviewed  CBC WITH DIFFERENTIAL/PLATELET - Abnormal; Notable for the following components:      Result Value   RBC 3.67 (*)    Hemoglobin 9.1 (*)    HCT 30.2 (*)    MCH 24.8 (*)    All other components within normal limits  BASIC METABOLIC PANEL - Abnormal; Notable for the following components:   Glucose, Bld 131 (*)    Calcium 8.2 (*)    All other components within normal limits     EKG  ED ECG REPORT I, Blake Divine, the attending physician, personally viewed and interpreted this ECG.   Date: 12/20/2022  EKG Time: 8:30  Rate: 68  Rhythm: normal sinus rhythm  Axis: Normal  Intervals:none  ST&T Change: None  RADIOLOGY Right hip x-ray reviewed and interpreted by me  with comminuted intertrochanteric right hip fracture.  Chest x-ray reviewed and interpreted by me with no infiltrate, edema, or effusion.  PROCEDURES:  Critical Care performed: No  Procedures   MEDICATIONS ORDERED IN ED: Medications  HYDROmorphone (DILAUDID) injection 0.5 mg (0.5 mg Intravenous Given 12/20/22 0810)  lactated ringers bolus 1,000 mL (1,000 mLs Intravenous New Bag/Given 12/20/22 0955)     IMPRESSION / MDM / ASSESSMENT AND PLAN / ED COURSE  I reviewed the triage vital signs and the nursing notes.                              89 y.o. male with past medical history of Alzheimer's dementia, hypertension, hyperlipidemia, and DVT on Eliquis who presents to the ED complaining of right hip pain after unwitnessed fall at his memory care facility this morning.  Patient's presentation is most consistent with  acute presentation with potential threat to life or bodily function.  Differential diagnosis includes, but is not limited to, hip fracture, intracranial injury, cervical spine injury, electrolyte abnormality, anemia.  Patient uncomfortable appearing, occasionally moaning in pain and clutching his right hip.  He remains neurovascularly intact with strong DP pulses distally, will further assess with x-ray of his right hip.  It is unclear whether he suffered significant head or neck trauma, given he is anticoagulated we will check CT head and cervical spine.  Plan to treat pain with IV Dilaudid and reassess following labs and imaging.  Right hip x-ray is significant for comminuted intertrochanteric fracture, chest x-ray is unremarkable.  CT head and cervical spine are negative for traumatic injury or other acute process.  Labs remarkable for anemia but no bleeding noted at this time, no significant leukocytosis, electrolyte abnormality, or AKI noted.  We will give follow-up dose of IV Dilaudid for ongoing pain, case discussed with Dr. Sharlet Salina of orthopedics who will tentatively plan for operative intervention tomorrow.  Case discussed with hospitalist for admission.      FINAL CLINICAL IMPRESSION(S) / ED DIAGNOSES   Final diagnoses:  Closed fracture of right hip, initial encounter [facility])  Fall, initial encounter     Rx / DC Orders   ED Discharge Orders     None        Note:  This document was prepared using Dragon voice recognition software and may include unintentional dictation errors.   Blake Divine, MD 12/20/22 1012

## 2022-12-20 NOTE — H&P (Signed)
History and Physical    Curtis West VWU:[id number] DOB: [birth year] DOA: 12/20/2022  Referring MD/NP/PA:   PCP: [place] Pink, MD   Patient coming from:  The patient is coming from SNF  Chief Complaint: fall and right hip pain  HPI: Curtis West is a 89 y.o. male with medical history significant of hypertension, hyperlipidemia, arthritis, DVT on Eliquis, and Alzheimer's dementia, who presents with fall and right hip pain.  Per his son at bedside, pt had an unwitnessed fall this morning at his memory care facility. No LOC is reported. Pt developed pain in the right hip, which seem to be severe and constant. His right leg is externally rotated.  Patient has nausea, no vomiting, diarrhea or abdominal pain.  No active respiratory distress, cough noted.  Does not seem to have chest pain. His last dose of Eliquis was in this AM. Pt has difficulty urinating in ED, Foley catheter is placed.   Data reviewed independently and ED Course: pt was found to have WBC 6.4, GFR> 60, temperature normal, blood pressure 98/48, heart rate 71, RR 16, oxygen saturation 100% on room air.  Chest x-ray negative.  CT of the head negative.  CT of C-spine negative for acute injury.  X-ray of right hip/pelvis showed comminuted, mildly displaced intertrochanteric fracture of the right femur. Pt is admitted to [facility] bed as inpatient.  Dr. Sharlet Salina of Ortho is consulted.   EKG: I have personally reviewed.  Sinus rhythm, QTc 458, low voltage, early R wave progression  Review of Systems:   General: no fevers, chills, no body weight gain, fatigue HEENT: no blurry vision, hearing changes or sore throat Respiratory: no dyspnea, coughing, wheezing CV: no chest pain, no palpitations GI: has nausea, no vomiting, abdominal pain, diarrhea, constipation GU: no dysuria, burning on urination, increased urinary frequency, hematuria  Ext: no leg edema Neuro: no unilateral weakness, numbness, or tingling, no vision change  or hearing loss. Has fall Skin: no rash, no skin tear. MSK: has right hip pain Heme: No easy bruising.  Travel history: No recent long distant travel.   Allergy:  Allergies  Allergen Reactions   Amoxicillin    Sulfa Antibiotics     "I get loopy"    Past Medical History:  Diagnosis Date   Allergy    Seasonal Allergies   Alzheimer disease ([place])    Arthritis    Asthma    GERD (gastroesophageal reflux disease)    Hyperlipidemia    Hypertension    Rosacea    Skin cancer     Past Surgical History:  Procedure Laterality Date   REPLACEMENT TOTAL KNEE BILATERAL Bilateral    SINUS LIFT WITH BONE GRAFT     TONSILLECTOMY      Social History:  reports that he has quit smoking. He has never used smokeless tobacco. He reports that he does not currently use alcohol. He reports that he does not currently use drugs.  Family History:  Family History  Problem Relation Age of Onset   ALS Father 17     Prior to Admission medications   Medication Sig Start Date End Date Taking? Authorizing Provider  ciprofloxacin (CIPRO) 250 MG tablet Take 250 mg by mouth 2 (two) times daily. 05/19/22   [provider]  ELIQUIS 5 MG TABS tablet Take 5 mg by mouth 2 (two) times daily. 05/31/22   [provider]  fluticasone (FLONASE) 50 MCG/ACT nasal spray Place into both nostrils. 05/23/22   [provider]  LORazepam (ATIVAN)  0.5 MG tablet Take 0.5 mg by mouth 2 (two) times daily as needed. 02/12/22   [provider]  Melatonin 5 MG CAPS Take by mouth.    [provider]  montelukast (SINGULAIR) 10 MG tablet Take 10 mg by mouth daily. 05/27/22   [provider]  naproxen sodium (ALEVE) 220 MG tablet Take by mouth. Patient not taking: Reported on 06/03/2022    [provider]  QUEtiapine (SEROQUEL) 50 MG tablet Take 50 mg by mouth at bedtime. 04/29/22   [provider]  QUEtiapine Fumarate (SEROQUEL XR) 150 MG 24 hr tablet Take 150 mg by  mouth at bedtime. 05/28/22   [provider]  rivastigmine (EXELON) 1.5 MG capsule Take by mouth. 12/16/19 01/15/20  [provider]  sertraline (ZOLOFT) 25 MG tablet Take 25 mg by mouth daily. 05/28/22   [provider]  sulfamethoxazole-trimethoprim (BACTRIM DS,SEPTRA DS) 800-160 MG tablet Take 1 tablet by mouth 2 (two) times daily. Patient not taking: Reported on 12/23/2019 09/09/17   Little, Traci M, PA-C  vitamin B-12 (CYANOCOBALAMIN) 500 MCG tablet Take by mouth.    [provider]    Physical Exam: Vitals:   12/20/22 0802 12/20/22 0804 12/20/22 0937 12/20/22 0941  BP: 126/74  (!) 98/48 (!) 98/48  Pulse: 66  71 71  Resp: 16   16  Temp: 98.1 F (36.7 C)   98.1 F (36.7 C)  TempSrc: Oral   Oral  SpO2: 100%   100%  Weight:  86.2 kg    Height:  6' (1.829 m)     General: Not in acute distress HEENT:       Eyes: PERRL, EOMI, no scleral icterus.       ENT: No discharge from the ears and nose, no pharynx injection, no tonsillar enlargement.        Neck: No JVD, no bruit, no mass felt. Heme: No neck lymph node enlargement. Cardiac: S1/S2, RRR, No murmurs, No gallops or rubs. Respiratory: No rales, wheezing, rhonchi or rubs. GI: Soft, nondistended, nontender, no rebound pain, no organomegaly, BS present. GU: No hematuria Ext: No pitting leg edema bilaterally. 1+DP/PT pulse bilaterally. Musculoskeletal: has right hip tenderness, right leg is externally rotated Skin: No rashes.  Neuro: Alert, cranial nerves II-XII grossly intact, moves all extremities.  Psych: Patient is not psychotic, no suicidal or hemocidal ideation.  Labs on Admission: I have personally reviewed following labs and imaging studies  CBC: Recent Labs  Lab 12/20/22 0834 12/20/22 1215  WBC 6.4 11.9*  NEUTROABS 3.6  --   HGB 9.1* 8.2*  HCT 30.2* 27.1*  MCV 82.3 82.9  PLT 260 856   Basic Metabolic Panel: Recent Labs  Lab 12/20/22 0909  NA 135  K 4.1  CL 104  CO2 23   GLUCOSE 131*  BUN 15  CREATININE 1.10  CALCIUM 8.2*   GFR: Estimated Creatinine Clearance: 50.9 mL/min (by C-G formula based on SCr of 1.1 mg/dL). Liver Function Tests: No results for input(s): "AST", "ALT", "ALKPHOS", "BILITOT", "PROT", "ALBUMIN" in the last 168 hours. No results for input(s): "LIPASE", "AMYLASE" in the last 168 hours. No results for input(s): "AMMONIA" in the last 168 hours. Coagulation Profile: No results for input(s): "INR", "PROTIME" in the last 168 hours. Cardiac Enzymes: No results for input(s): "CKTOTAL", "CKMB", "CKMBINDEX", "TROPONINI" in the last 168 hours. BNP (last 3 results) No results for input(s): "PROBNP" in the last 8760 hours. HbA1C: No results for input(s): "HGBA1C" in the last 72 hours.  CBG: No results for input(s): "GLUCAP" in the last 168 hours. Lipid Profile: No results for input(s): "CHOL", "HDL", "LDLCALC", "TRIG", "CHOLHDL", "LDLDIRECT" in the last 72 hours. Thyroid Function Tests: No results for input(s): "TSH", "T4TOTAL", "FREET4", "T3FREE", "THYROIDAB" in the last 72 hours. Anemia Panel: No results for input(s): "VITAMINB12", "FOLATE", "FERRITIN", "TIBC", "IRON", "RETICCTPCT" in the last 72 hours. Urine analysis:    Component Value Date/Time   COLORURINE YELLOW (A) 12/15/2019 1551   APPEARANCEUR CLEAR (A) 12/15/2019 1551   LABSPEC 1.019 12/15/2019 1551   PHURINE 6.0 12/15/2019 1551   GLUCOSEU NEGATIVE 12/15/2019 1551   HGBUR NEGATIVE 12/15/2019 1551   [place] 12/15/2019 1551   KETONESUR NEGATIVE 12/15/2019 1551   PROTEINUR 30 (A) 12/15/2019 1551   NITRITE NEGATIVE 12/15/2019 1551   LEUKOCYTESUR NEGATIVE 12/15/2019 1551   Sepsis Labs: '@LABRCNTIP'$ (procalcitonin:4,lacticidven:4) )No results found for this or any previous visit (from the past 240 hour(s)).   Radiological Exams on Admission: CT Head Wo Contrast  Result Date: 12/20/2022 CLINICAL DATA:  Head trauma, minor.  Neck trauma. EXAM: CT HEAD WITHOUT  CONTRAST CT CERVICAL SPINE WITHOUT CONTRAST TECHNIQUE: Multidetector CT imaging of the head and cervical spine was performed following the standard protocol without intravenous contrast. Multiplanar CT image reconstructions of the cervical spine were also generated. RADIATION DOSE REDUCTION: This exam was performed according to the departmental dose-optimization program which includes automated exposure control, adjustment of the mA and/or kV according to patient size and/or use of iterative reconstruction technique. COMPARISON:  CT head and cervical spine 01/29/2021. FINDINGS: CT HEAD FINDINGS Brain: No acute intracranial hemorrhage or infarct. No mass effect or midline shift. Stable patchy hypoattenuation of the periventricular white matter, most consistent with chronic small vessel disease. Stable prominence of the ventricles and sulci. No extra-axial collection. Basilar cisterns are patent. Vascular: No hyperdense vessel. Skull: No calvarial fracture.  Skull base is unremarkable. Sinuses/Orbits: Mild mucosal thickening in the right maxillary sinus. Frothy secretions in the left sphenoid sinus. Orbits are unremarkable. Mastoids and middle ear cavities are well aerated. Other: No soft tissue findings. CT CERVICAL SPINE FINDINGS Alignment: Stable 3 mm anterolisthesis of C4 on C5. Skull base and vertebrae: Stable degenerative changes of the C1-2 level. Normal craniocervical junction. Mild motion artifact through the midcervical spine. No acute fracture. Stable moderate degenerative changes of the lower cervical disc spaces. Heterogeneity of the bones, compatible with osteopenia. Unchanged upper cervical facet arthropathy. Soft tissues and spinal canal: No prevertebral fluid or swelling. No visible canal hematoma. Disc levels: New high-grade spinal canal stenosis. At least moderate neural foraminal narrowing at nearly every level. Upper chest: Biapical pleural-parenchymal scarring. IMPRESSION: 1. No acute  intracranial injury. 2. No acute fracture or traumatic subluxation in the cervical spine. Electronically Signed   By: Emmit Alexanders M.D   On: 12/20/2022 09:13   CT Cervical Spine Wo Contrast  Result Date: 12/20/2022 CLINICAL DATA:  Head trauma, minor.  Neck trauma. EXAM: CT HEAD WITHOUT CONTRAST CT CERVICAL SPINE WITHOUT CONTRAST TECHNIQUE: Multidetector CT imaging of the head and cervical spine was performed following the standard protocol without intravenous contrast. Multiplanar CT image reconstructions of the cervical spine were also generated. RADIATION DOSE REDUCTION: This exam was performed according to the departmental dose-optimization program which includes automated exposure control, adjustment of the mA and/or kV according to patient size and/or use of iterative reconstruction technique. COMPARISON:  CT head and cervical spine 01/29/2021. FINDINGS: CT HEAD FINDINGS Brain: No acute intracranial hemorrhage or infarct. No mass effect or midline  shift. Stable patchy hypoattenuation of the periventricular white matter, most consistent with chronic small vessel disease. Stable prominence of the ventricles and sulci. No extra-axial collection. Basilar cisterns are patent. Vascular: No hyperdense vessel. Skull: No calvarial fracture.  Skull base is unremarkable. Sinuses/Orbits: Mild mucosal thickening in the right maxillary sinus. Frothy secretions in the left sphenoid sinus. Orbits are unremarkable. Mastoids and middle ear cavities are well aerated. Other: No soft tissue findings. CT CERVICAL SPINE FINDINGS Alignment: Stable 3 mm anterolisthesis of C4 on C5. Skull base and vertebrae: Stable degenerative changes of the C1-2 level. Normal craniocervical junction. Mild motion artifact through the midcervical spine. No acute fracture. Stable moderate degenerative changes of the lower cervical disc spaces. Heterogeneity of the bones, compatible with osteopenia. Unchanged upper cervical facet arthropathy. Soft  tissues and spinal canal: No prevertebral fluid or swelling. No visible canal hematoma. Disc levels: New high-grade spinal canal stenosis. At least moderate neural foraminal narrowing at nearly every level. Upper chest: Biapical pleural-parenchymal scarring. IMPRESSION: 1. No acute intracranial injury. 2. No acute fracture or traumatic subluxation in the cervical spine. Electronically Signed   By: Emmit Alexanders M.D   On: 12/20/2022 09:13   DG Hip Unilat W or Wo Pelvis 2-3 Views Right  Result Date: 12/20/2022 CLINICAL DATA:  Fall with pain in the right hip EXAM: DG HIP (WITH OR WITHOUT PELVIS) 3V RIGHT COMPARISON:  None Available. FINDINGS: Fracture: Comminuted mildly displaced intertrochanteric fracture of the right femur. Femoroacetabular joint is maintained. Healing: None. Soft tissue: Normal. IMPRESSION: Comminuted, mildly displaced intertrochanteric fracture of the right femur. Electronically Signed   By: Darrin Nipper M.D.   On: 12/20/2022 08:54   DG Chest 1 View  Result Date: 12/20/2022 CLINICAL DATA:  Fall with right hip fracture. Preoperative respiratory exam. EXAM: CHEST  1 VIEW COMPARISON:  05/06/2019 FINDINGS: Top-normal heart size. Probable component some degree chronic lung disease. There is no evidence of pulmonary edema, consolidation, pneumothorax or pleural fluid. The visualized skeletal structures are unremarkable. IMPRESSION: Top-normal heart size with probable component of chronic lung disease. No acute findings. Electronically Signed   By: Aletta Edouard M.D.   On: 12/20/2022 08:53      Assessment/Plan Principal Problem:   Closed right hip fracture (HCC) Active Problems:   Fall at home, initial encounter   HTN (hypertension)   DVT, lower extremity ([place])   Normocytic anemia   Asthma without status asthmaticus   Depression with anxiety   Assessment and Plan:  Closed right hip fracture [facility]): - will admit to Med-surg bed - Pain control: prn morphine, percocet and  tyleno - When necessary Zofran for nausea - Robaxin for muscle spasm - Lidoderm patch for pain - type and cross - INR/PTT -PT/OT when able to (not ordered now)  Fall at home, initial encounter: -PT/OT when able to   HTN (hypertension): Blood pressure 93/48.  Patient is not taking medications currently -IV hydralazine as needed  DVT, lower extremity (HCC) -Switched Eliquis to IV heparin  Normocytic anemia: Hemoglobin 9.1 (12.9 on 10/17/2020) -Follow-up CBC every 12 hours  Asthma without status asthmaticus -Bronchodilators  Depression with anxiety -Continue home medications      DVT ppx: on IV Heparin          Code Status: DNR (pt has DNR document from her facility)  Family Communication: his son is at bedside  Disposition Plan:  Anticipate discharge back to previous environment, SNF  Consults called:  Dr. Sharlet Salina of Ortho is consulted.  Admission status  and Level of care: Med-Surg:    as inpt         Dispo: The patient is from: SNF              Anticipated d/c is to: SNF              Anticipated d/c date is: 2 days              Patient currently is not medically stable to d/c.    Severity of Illness:  The appropriate patient status for this patient is INPATIENT. Inpatient status is judged to be reasonable and necessary in order to provide the required intensity of service to ensure the patient's safety. The patient's presenting symptoms, physical exam findings, and initial radiographic and laboratory data in the context of their chronic comorbidities is felt to place them at high risk for further clinical deterioration. Furthermore, it is not anticipated that the patient will be medically stable for discharge from the hospital within 2 midnights of admission.   * I certify that at the point of admission it is my clinical judgment that the patient will require inpatient hospital care spanning beyond 2 midnights from the point of admission due to high intensity of  service, high risk for further deterioration and high frequency of surveillance required.*       Date of Service 12/20/2022    Ivor Costa [facility]   If 7PM-7AM, please contact night-coverage [email or web address] 12/20/2022, 12:34 PM

## 2022-12-20 NOTE — Progress Notes (Signed)
ANTICOAGULATION CONSULT NOTE - Initial Consult  Pharmacy Consult for IV heparin Indication: History of DVT   Allergies  Allergen Reactions   Amoxicillin    Sulfa Antibiotics     "I get loopy"    Patient Measurements: Height: 6' (182.9 cm) Weight: 86.2 kg (190 lb) IBW/kg (Calculated) : 77.6 Heparin Dosing Weight: 86.2 kg  Vital Signs: Temp: 98.1 F (36.7 C) (01/12 0941) Temp Source: Oral (01/12 0941) BP: 98/48 (01/12 0941) Pulse Rate: 71 (01/12 0941)  Labs: Recent Labs    12/20/22 0834 12/20/22 0909  HGB 9.1*  --   HCT 30.2*  --   PLT 260  --   CREATININE  --  1.10    Estimated Creatinine Clearance: 50.9 mL/min (by C-G formula based on SCr of 1.1 mg/dL).   Medical History: Past Medical History:  Diagnosis Date   Allergy    Seasonal Allergies   Alzheimer disease (HCC)    Arthritis    Asthma    GERD (gastroesophageal reflux disease)    Hyperlipidemia    Hypertension    Rosacea    Skin cancer     Medications:  Eliquis 5 mg BID (last dose 1/12 '@0800'$ )  Assessment: 89 year old male admitted due to hip fracture. PMH includes DVT (on Eliquis prior to admission). Holding Eliquis due to upcoming right hip surgery. Pharmacy has been consulted to start IV heparin.  Baseline H&H stable. aPTT 27, INR 1.2.   Goal of Therapy:  aPTT 66-102 seconds Monitor platelets by anticoagulation protocol: Yes   Plan:  No bolus. Start IV heparin 1400 units/hr '@2000'$ . Check aPTT 8 hours after initiation of infusion. Plan to dose heparin via aPTT levels until correlation with HL.  CBC daily   Glean Salvo, PharmD, BCPS Clinical Pharmacist  12/20/2022 1:05 PM

## 2022-12-21 ENCOUNTER — Inpatient Hospital Stay: Payer: Medicare Other | Admitting: Certified Registered"

## 2022-12-21 ENCOUNTER — Encounter
Admission: EM | Disposition: A | Discharge status: Skilled Nursing Facility | Payer: Self-pay | Source: Home / Self Care | Attending: Internal Medicine

## 2022-12-21 ENCOUNTER — Encounter: Payer: Self-pay | Admitting: Internal Medicine

## 2022-12-21 ENCOUNTER — Inpatient Hospital Stay: Payer: Medicare Other

## 2022-12-21 DIAGNOSIS — S72001A Fracture of unspecified part of neck of right femur, initial encounter for closed fracture: Secondary | ICD-10-CM | POA: Diagnosis not present

## 2022-12-21 HISTORY — PX: INTRAMEDULLARY (IM) NAIL INTERTROCHANTERIC: SHX5875

## 2022-12-21 LAB — BASIC METABOLIC PANEL
Anion gap: 6 (ref 5–15)
BUN: 17 mg/dL (ref 8–23)
CO2: 23 mmol/L (ref 22–32)
Calcium: 7.9 mg/dL — ABNORMAL LOW (ref 8.9–10.3)
Chloride: 105 mmol/L (ref 98–111)
Creatinine, Ser: 1.05 mg/dL (ref 0.61–1.24)
GFR, Estimated: >60 mL/min (ref >60)
Glucose, Bld: 132 mg/dL — ABNORMAL HIGH (ref 70–99)
Potassium: 4.1 mmol/L (ref 3.5–5.1)
Sodium: 134 mmol/L — ABNORMAL LOW (ref 135–145)

## 2022-12-21 LAB — CBC
HCT: 22.5 % — ABNORMAL LOW (ref 39.0–52.0)
HCT: 27.7 % — ABNORMAL LOW (ref 39.0–52.0)
HCT: 24.2 % — ABNORMAL LOW (ref 39.0–52.0)
Hemoglobin: 6.8 g/dL — ABNORMAL LOW (ref 13.0–17.0)
Hemoglobin: 8.8 g/dL — ABNORMAL LOW (ref 13.0–17.0)
Hemoglobin: 7.6 g/dL — ABNORMAL LOW (ref 13.0–17.0)
MCH: 24.9 pg — ABNORMAL LOW (ref 26.0–34.0)
MCH: 26.2 pg (ref 26.0–34.0)
MCH: 26.1 pg (ref 26.0–34.0)
MCHC: 30.2 g/dL (ref 30.0–36.0)
MCHC: 31.8 g/dL (ref 30.0–36.0)
MCHC: 31.4 g/dL (ref 30.0–36.0)
MCV: 82.4 fL (ref 80.0–100.0)
MCV: 82.4 fL (ref 80.0–100.0)
MCV: 83.2 fL (ref 80.0–100.0)
Platelets: 174 10*3/uL (ref 150–400)
Platelets: 166 10*3/uL (ref 150–400)
Platelets: 148 10*3/uL — ABNORMAL LOW (ref 150–400)
RBC: 2.73 MIL/uL — ABNORMAL LOW (ref 4.22–5.81)
RBC: 3.36 MIL/uL — ABNORMAL LOW (ref 4.22–5.81)
RBC: 2.91 MIL/uL — ABNORMAL LOW (ref 4.22–5.81)
RDW: 14.4 % (ref 11.5–15.5)
RDW: 14.2 % (ref 11.5–15.5)
RDW: 14.3 % (ref 11.5–15.5)
WBC: 9.7 10*3/uL (ref 4.0–10.5)
WBC: 11.9 10*3/uL — ABNORMAL HIGH (ref 4.0–10.5)
WBC: 9.6 10*3/uL (ref 4.0–10.5)
nRBC: 0 % (ref 0.0–0.2)
nRBC: 0 % (ref 0.0–0.2)
nRBC: 0 % (ref 0.0–0.2)

## 2022-12-21 LAB — APTT: aPTT: 132 seconds — ABNORMAL HIGH (ref 24–36)

## 2022-12-21 LAB — HEPARIN LEVEL (UNFRACTIONATED): Heparin Unfractionated: 0.89 IU/mL — ABNORMAL HIGH (ref 0.30–0.70)

## 2022-12-21 LAB — MRSA NEXT GEN BY PCR, NASAL: MRSA by PCR Next Gen: NOT DETECTED

## 2022-12-21 LAB — ABO/RH: ABO/RH(D): O NEG

## 2022-12-21 LAB — PREPARE RBC (CROSSMATCH)

## 2022-12-21 SURGERY — FIXATION, FRACTURE, INTERTROCHANTERIC, WITH INTRAMEDULLARY ROD
Anesthesia: General | Site: Hip | Laterality: Right

## 2022-12-21 MED ORDER — ONDANSETRON HCL 4 MG PO TABS: 4.0000 mg | ORAL_TABLET | Freq: Four times a day (QID) | ORAL | Status: DC | PRN

## 2022-12-21 MED ORDER — MENTHOL 3 MG MT LOZG: 1.0000 | LOZENGE | OROMUCOSAL | Status: DC | PRN

## 2022-12-21 MED ORDER — EPHEDRINE SULFATE (PRESSORS) 50 MG/ML IJ SOLN
INTRAMUSCULAR | Status: DC | PRN
  Administered 2022-12-21: 10 mg via INTRAVENOUS

## 2022-12-21 MED ORDER — SODIUM CHLORIDE 0.9 % IR SOLN: Status: DC | PRN

## 2022-12-21 MED ORDER — HYDROCODONE-ACETAMINOPHEN 7.5-325 MG PO TABS
1.0000 | ORAL_TABLET | ORAL | Status: DC | PRN
  Administered 2022-12-24: 1 via ORAL
  Administered 2022-12-24 (×2): 2 via ORAL
  Administered 2022-12-25: 1 via ORAL
  Administered 2022-12-26 (×3): 2 via ORAL
  Filled 2022-12-21 (×2): qty 2
  Filled 2022-12-21: qty 1
  Filled 2022-12-21 (×2): qty 2
  Filled 2022-12-21: qty 1
  Filled 2022-12-21: qty 2

## 2022-12-21 MED ORDER — FENTANYL CITRATE (PF) 100 MCG/2ML IJ SOLN
INTRAMUSCULAR | Status: AC
  Filled 2022-12-21: qty 2

## 2022-12-21 MED ORDER — METOCLOPRAMIDE HCL 5 MG PO TABS: 5.0000 mg | ORAL_TABLET | Freq: Three times a day (TID) | ORAL | Status: DC | PRN

## 2022-12-21 MED ORDER — METOCLOPRAMIDE HCL 5 MG/ML IJ SOLN: 5.0000 mg | Freq: Three times a day (TID) | INTRAMUSCULAR | Status: DC | PRN

## 2022-12-21 MED ORDER — HYDROCODONE-ACETAMINOPHEN 5-325 MG PO TABS
1.0000 | ORAL_TABLET | ORAL | Status: DC | PRN
  Administered 2022-12-21 – 2022-12-23 (×6): 1 via ORAL
  Filled 2022-12-21 (×6): qty 1

## 2022-12-21 MED ORDER — ONDANSETRON HCL 4 MG/2ML IJ SOLN: 4.0000 mg | Freq: Four times a day (QID) | INTRAMUSCULAR | Status: DC | PRN

## 2022-12-21 MED ORDER — HEPARIN (PORCINE) 25000 UT/250ML-% IV SOLN: 1150.0000 [IU]/h | INTRAVENOUS | Status: DC

## 2022-12-21 MED ORDER — PROPOFOL 10 MG/ML IV BOLUS
INTRAVENOUS | Status: AC
  Filled 2022-12-21: qty 40

## 2022-12-21 MED ORDER — LIDOCAINE HCL (CARDIAC) PF 100 MG/5ML IV SOSY
PREFILLED_SYRINGE | INTRAVENOUS | Status: DC | PRN
  Administered 2022-12-21: 100 mg via INTRAVENOUS

## 2022-12-21 MED ORDER — ACETAMINOPHEN 10 MG/ML IV SOLN
INTRAVENOUS | Status: DC | PRN
  Administered 2022-12-21: 1000 mg via INTRAVENOUS

## 2022-12-21 MED ORDER — PHENYLEPHRINE HCL-NACL 20-0.9 MG/250ML-% IV SOLN
INTRAVENOUS | Status: DC | PRN
  Administered 2022-12-21: 40 ug/min via INTRAVENOUS

## 2022-12-21 MED ORDER — ACETAMINOPHEN 500 MG PO TABS
500.0000 mg | ORAL_TABLET | Freq: Four times a day (QID) | ORAL | Status: AC
  Administered 2022-12-21 – 2022-12-22 (×2): 500 mg via ORAL
  Filled 2022-12-21 (×2): qty 1

## 2022-12-21 MED ORDER — MORPHINE SULFATE (PF) 2 MG/ML IV SOLN: 0.5000 mg | INTRAVENOUS | Status: DC | PRN

## 2022-12-21 MED ORDER — LIDOCAINE HCL (PF) 2 % IJ SOLN
INTRAMUSCULAR | Status: AC
  Filled 2022-12-21: qty 5

## 2022-12-21 MED ORDER — BUPIVACAINE-EPINEPHRINE 0.5% -1:200000 IJ SOLN
INTRAMUSCULAR | Status: DC | PRN
  Administered 2022-12-21: 15 mL

## 2022-12-21 MED ORDER — LIDOCAINE HCL 1 % IJ SOLN
INTRAMUSCULAR | Status: DC | PRN
  Administered 2022-12-21: 15 mL

## 2022-12-21 MED ORDER — ROCURONIUM BROMIDE 10 MG/ML (PF) SYRINGE
PREFILLED_SYRINGE | INTRAVENOUS | Status: AC
  Filled 2022-12-21: qty 10

## 2022-12-21 MED ORDER — DOCUSATE SODIUM 100 MG PO CAPS
100.0000 mg | ORAL_CAPSULE | Freq: Two times a day (BID) | ORAL | Status: DC
  Administered 2022-12-21 – 2022-12-25 (×9): 100 mg via ORAL
  Filled 2022-12-21 (×10): qty 1

## 2022-12-21 MED ORDER — ONDANSETRON HCL 4 MG/2ML IJ SOLN
INTRAMUSCULAR | Status: DC | PRN
  Administered 2022-12-21: 4 mg via INTRAVENOUS

## 2022-12-21 MED ORDER — ADULT MULTIVITAMIN W/MINERALS CH
1.0000 | ORAL_TABLET | Freq: Every day | ORAL | Status: DC
  Administered 2022-12-22 – 2022-12-26 (×5): 1 via ORAL
  Filled 2022-12-21 (×5): qty 1

## 2022-12-21 MED ORDER — ONDANSETRON HCL 4 MG/2ML IJ SOLN
INTRAMUSCULAR | Status: AC
  Filled 2022-12-21: qty 2

## 2022-12-21 MED ORDER — PHENOL 1.4 % MT LIQD: 1.0000 | OROMUCOSAL | Status: DC | PRN

## 2022-12-21 MED ORDER — LACTATED RINGERS IV SOLN: INTRAVENOUS | Status: DC | PRN

## 2022-12-21 MED ORDER — ACETAMINOPHEN 325 MG PO TABS
325.0000 mg | ORAL_TABLET | Freq: Four times a day (QID) | ORAL | Status: DC | PRN
  Administered 2022-12-22: 325 mg via ORAL
  Filled 2022-12-21: qty 2

## 2022-12-21 MED ORDER — 0.9 % SODIUM CHLORIDE (POUR BTL) OPTIME
TOPICAL | Status: DC | PRN
  Administered 2022-12-21: 1000 mL

## 2022-12-21 MED ORDER — PHENYLEPHRINE 80 MCG/ML (10ML) SYRINGE FOR IV PUSH (FOR BLOOD PRESSURE SUPPORT)
PREFILLED_SYRINGE | INTRAVENOUS | Status: AC
  Filled 2022-12-21: qty 10

## 2022-12-21 MED ORDER — ROCURONIUM BROMIDE 100 MG/10ML IV SOLN
INTRAVENOUS | Status: DC | PRN
  Administered 2022-12-21: 50 mg via INTRAVENOUS
  Administered 2022-12-21: 10 mg via INTRAVENOUS

## 2022-12-21 MED ORDER — FENTANYL CITRATE (PF) 100 MCG/2ML IJ SOLN
INTRAMUSCULAR | Status: DC | PRN
  Administered 2022-12-21: 50 ug via INTRAVENOUS
  Administered 2022-12-21 (×2): 25 ug via INTRAVENOUS

## 2022-12-21 MED ORDER — HYDROCODONE-ACETAMINOPHEN 5-325 MG PO TABS: 1.0000 | ORAL_TABLET | ORAL | 0 refills | Status: DC | PRN

## 2022-12-21 MED ORDER — SODIUM CHLORIDE 0.9% IV SOLUTION: Freq: Once | INTRAVENOUS | Status: AC

## 2022-12-21 MED ORDER — ENSURE ENLIVE PO LIQD
237.0000 mL | Freq: Two times a day (BID) | ORAL | Status: DC
  Administered 2022-12-22 – 2022-12-23 (×3): 237 mL via ORAL

## 2022-12-21 MED ORDER — PHENYLEPHRINE HCL-NACL 20-0.9 MG/250ML-% IV SOLN
INTRAVENOUS | Status: AC
  Filled 2022-12-21: qty 250

## 2022-12-21 MED ORDER — EPHEDRINE 5 MG/ML INJ
INTRAVENOUS | Status: AC
  Filled 2022-12-21: qty 5

## 2022-12-21 MED ORDER — CEFAZOLIN SODIUM-DEXTROSE 2-4 GM/100ML-% IV SOLN
2.0000 g | Freq: Four times a day (QID) | INTRAVENOUS | Status: AC
  Administered 2022-12-21: 2 g via INTRAVENOUS
  Filled 2022-12-21 (×2): qty 100

## 2022-12-21 MED ORDER — PROPOFOL 10 MG/ML IV BOLUS
INTRAVENOUS | Status: DC | PRN
  Administered 2022-12-21: 120 mg via INTRAVENOUS

## 2022-12-21 MED ORDER — PHENYLEPHRINE 80 MCG/ML (10ML) SYRINGE FOR IV PUSH (FOR BLOOD PRESSURE SUPPORT)
PREFILLED_SYRINGE | INTRAVENOUS | Status: DC | PRN
  Administered 2022-12-21: 160 ug via INTRAVENOUS
  Administered 2022-12-21 (×2): 80 ug via INTRAVENOUS

## 2022-12-21 MED ORDER — SUGAMMADEX SODIUM 200 MG/2ML IV SOLN
INTRAVENOUS | Status: DC | PRN
  Administered 2022-12-21: 200 mg via INTRAVENOUS

## 2022-12-21 MED ORDER — ACETAMINOPHEN 10 MG/ML IV SOLN
INTRAVENOUS | Status: AC
  Filled 2022-12-21: qty 100

## 2022-12-21 MED ORDER — CEFAZOLIN SODIUM-DEXTROSE 2-4 GM/100ML-% IV SOLN
INTRAVENOUS | Status: AC
  Administered 2022-12-21: 2 g via INTRAVENOUS
  Filled 2022-12-21: qty 100

## 2022-12-21 SURGICAL SUPPLY — 53 items
BIT DRILL INTERTAN LAG SCREW (BIT) IMPLANT
BIT DRILL SHORT 4.0 (BIT) IMPLANT
BNDG COHESIVE 6X5 TAN ST LF (GAUZE/BANDAGES/DRESSINGS) ×3 IMPLANT
CHLORAPREP W/TINT 26 (MISCELLANEOUS) ×1 IMPLANT
DRAPE 3/4 80X56 (DRAPES) ×2 IMPLANT
DRAPE C-ARM 42X72 X-RAY (DRAPES) ×1 IMPLANT
DRAPE SURG 17X11 SM STRL (DRAPES) ×2 IMPLANT
DRAPE U-SHAPE 47X51 STRL (DRAPES) ×1 IMPLANT
DRILL BIT SHORT 4.0 (BIT) ×2
DRSG OPSITE POSTOP 3X4 (GAUZE/BANDAGES/DRESSINGS) ×2 IMPLANT
DRSG OPSITE POSTOP 4X14 (GAUZE/BANDAGES/DRESSINGS) IMPLANT
DRSG OPSITE POSTOP 4X6 (GAUZE/BANDAGES/DRESSINGS) ×1 IMPLANT
ELECT REM PT RETURN 9FT ADLT (ELECTROSURGICAL) ×1
ELECTRODE REM PT RTRN 9FT ADLT (ELECTROSURGICAL) ×1 IMPLANT
GAUZE SPONGE 4X4 12PLY STRL (GAUZE/BANDAGES/DRESSINGS) ×1 IMPLANT
GAUZE XEROFORM 1X8 LF (GAUZE/BANDAGES/DRESSINGS) ×1 IMPLANT
GLOVE BIO SURGEON STRL SZ7.5 (GLOVE) ×1 IMPLANT
GLOVE SURG UNDER POLY LF SZ7.5 (GLOVE) ×1 IMPLANT
GOWN STRL REUS W/ TWL XL LVL3 (GOWN DISPOSABLE) ×1 IMPLANT
GOWN STRL REUS W/TWL XL LVL3 (GOWN DISPOSABLE) ×1
GOWN STRL REUS W/TWL XL LVL4 (GOWN DISPOSABLE) ×1 IMPLANT
GUIDE PIN 3.2X343 (PIN) ×2
GUIDE PIN 3.2X343MM (PIN) ×2
GUIDE ROD 3.0 (MISCELLANEOUS) ×1
HEMOVAC 400CC 10FR (MISCELLANEOUS) IMPLANT
HOLDER FOLEY CATH W/STRAP (MISCELLANEOUS) ×1 IMPLANT
KIT TURNOVER CYSTO (KITS) ×1 IMPLANT
MANIFOLD NEPTUNE II (INSTRUMENTS) ×1 IMPLANT
MAT ABSORB  FLUID 56X50 GRAY (MISCELLANEOUS) ×1
MAT ABSORB FLUID 56X50 GRAY (MISCELLANEOUS) ×1 IMPLANT
NAIL TRIGEN 11.5X440 13D RT (Nail) IMPLANT
NS IRRIG 1000ML POUR BTL (IV SOLUTION) ×1 IMPLANT
PACK HIP COMPR (MISCELLANEOUS) ×1 IMPLANT
PAD ABD DERMACEA PRESS 5X9 (GAUZE/BANDAGES/DRESSINGS) ×1 IMPLANT
PAD ARMBOARD 7.5X6 YLW CONV (MISCELLANEOUS) ×1 IMPLANT
PIN GUIDE 3.2X343MM (PIN) IMPLANT
ROD GUIDE 3.0 (MISCELLANEOUS) IMPLANT
SCREW LAG COMPR KIT 105/100 (Screw) IMPLANT
SCREW TRIGEN LOW PROF 5.0X45 (Screw) IMPLANT
SCREW TRIGEN LOW PROF 5.0X47.5 (Screw) IMPLANT
STAPLER SKIN PROX 35W (STAPLE) ×1 IMPLANT
SUCTION FRAZIER HANDLE 10FR (MISCELLANEOUS) ×1
SUCTION TUBE FRAZIER 10FR DISP (MISCELLANEOUS) ×1 IMPLANT
SUT VIC AB 0 CT1 36 (SUTURE) ×2 IMPLANT
SUT VIC AB 2-0 CT1 (SUTURE) ×1 IMPLANT
SUT VIC AB 2-0 CT1 27 (SUTURE) ×1
SUT VIC AB 2-0 CT1 TAPERPNT 27 (SUTURE) ×1 IMPLANT
SUT VICRYL 0 UR6 27IN ABS (SUTURE) ×1 IMPLANT
SYR 30ML LL (SYRINGE) ×1 IMPLANT
TAPE PAPER 1/2X10 TAN MEDIPORE (MISCELLANEOUS) ×1 IMPLANT
TRAP FLUID SMOKE EVACUATOR (MISCELLANEOUS) ×1 IMPLANT
TRAY FOLEY SLVR 16FR LF STAT (SET/KITS/TRAYS/PACK) ×1 IMPLANT
WATER STERILE IRR 500ML POUR (IV SOLUTION) ×1 IMPLANT

## 2022-12-21 NOTE — Assessment & Plan Note (Signed)
Patient is not on any medications at home.  Borderline soft blood pressure. -Continue to monitor

## 2022-12-21 NOTE — Transfer of Care (Signed)
Immediate Anesthesia Transfer of Care Note  Patient: Curtis West  Procedure(s) Performed: INTRAMEDULLARY (IM) NAIL INTERTROCHANTERIC (Right: Hip)  Patient Location: PACU  Anesthesia Type:General  Level of Consciousness: drowsy  Airway & Oxygen Therapy: Patient Spontanous Breathing and Patient connected to face mask oxygen  Post-op Assessment: Report given to RN, Post -op Vital signs reviewed and stable, and Patient moving all extremities  Post vital signs: Reviewed and stable  Last Vitals:  Vitals Value Taken Time  BP 111/54 12/21/22 0949  Temp 36.6 C 12/21/22 0944  Pulse 66 12/21/22 0950  Resp 12 12/21/22 0950  SpO2 100 % 12/21/22 0950  Vitals shown include unvalidated device data.  Last Pain:  Vitals:   12/21/22 0558  TempSrc:   PainSc: Asleep         Complications: No notable events documented.

## 2022-12-21 NOTE — Anesthesia Procedure Notes (Signed)
Procedure Name: Intubation Date/Time: 12/21/2022 7:55 AM  Performed by: Esaw Grandchild, CRNAPre-anesthesia Checklist: Patient identified, Emergency Drugs available, Suction available and Patient being monitored Patient Re-evaluated:Patient Re-evaluated prior to induction Oxygen Delivery Method: Circle system utilized Preoxygenation: Pre-oxygenation with 100% oxygen Induction Type: IV induction Ventilation: Mask ventilation without difficulty and Oral airway inserted - appropriate to patient size Laryngoscope Size: Sabra Heck and 2 Grade View: Grade I Tube type: Oral Tube size: 7.5 mm Number of attempts: 1 Airway Equipment and Method: Stylet, Oral airway and Bite block Placement Confirmation: ETT inserted through vocal cords under direct vision, positive ETCO2 and breath sounds checked- equal and bilateral Secured at: 22 cm Tube secured with: Tape Dental Injury: Teeth and Oropharynx as per pre-operative assessment

## 2022-12-21 NOTE — Assessment & Plan Note (Signed)
-  PT/OT evaluation

## 2022-12-21 NOTE — Assessment & Plan Note (Signed)
-  Continue home meds °

## 2022-12-21 NOTE — Consult Note (Signed)
ORTHOPAEDIC CONSULTATION  REQUESTING PHYSICIAN: Lorella Nimrod, MD  Chief Complaint: Right hip pain  HPI: Curtis West is a 89 y.o. male who complains of right hip pain after mechanical fall.  Patient currently resides at a skilled nursing facility past medical history notable for Alzheimer's dementia and DVT on Eliquis.  History was obtained from medical record as well as patient's son.  X-rays were obtained in the ER which showed presence of a comminuted right intertrochanteric hip fracture.  Orthopedics was consulted regarding further management.  Past Medical History:  Diagnosis Date   Allergy    Seasonal Allergies   Alzheimer disease (HCC)    Arthritis    Asthma    GERD (gastroesophageal reflux disease)    Hyperlipidemia    Hypertension    Rosacea    Skin cancer    Past Surgical History:  Procedure Laterality Date   REPLACEMENT TOTAL KNEE BILATERAL Bilateral    SINUS LIFT WITH BONE GRAFT     TONSILLECTOMY     Social History   Socioeconomic History   Marital status: Married    Spouse name: Not on file   Number of children: Not on file   Years of education: Not on file   Highest education level: Not on file  Occupational History   Not on file  Tobacco Use   Smoking status: Former   Smokeless tobacco: Never  Substance and Sexual Activity   Alcohol use: Not Currently   Drug use: Not Currently   Sexual activity: Not on file  Other Topics Concern   Not on file  Social History Narrative   Not on file   Social Determinants of Health   Financial Resource Strain: Not on file  Food Insecurity: Not on file  Transportation Needs: Not on file  Physical Activity: Not on file  Stress: Not on file  Social Connections: Not on file   Family History  Problem Relation Age of Onset   ALS Father 97   Allergies  Allergen Reactions   Amoxicillin    Sulfa Antibiotics     "I get loopy"   Prior to Admission medications   Medication Sig Start Date End Date Taking?  Authorizing Provider  ELIQUIS 5 MG TABS tablet Take 5 mg by mouth 2 (two) times daily. 05/31/22  Yes [provider]  fluticasone (FLONASE) 50 MCG/ACT nasal spray Place 1 spray into both nostrils 2 (two) times daily. 05/23/22  Yes [provider]  Melatonin 5 MG CAPS Take by mouth.   Yes [provider]  QUEtiapine (SEROQUEL) 25 MG tablet Take 25 mg by mouth daily.   Yes [provider]  QUEtiapine (SEROQUEL) 50 MG tablet Take 50 mg by mouth at bedtime. 04/29/22  Yes [provider]  sertraline (ZOLOFT) 25 MG tablet Take 25 mg by mouth daily. 05/28/22  Yes [provider]  ciprofloxacin (CIPRO) 250 MG tablet Take 250 mg by mouth 2 (two) times daily. Patient not taking: Reported on 12/20/2022 05/19/22   [provider]  LORazepam (ATIVAN) 0.5 MG tablet Take 0.5 mg by mouth 2 (two) times daily as needed. Patient not taking: Reported on 12/20/2022 02/12/22   [provider]  montelukast (SINGULAIR) 10 MG tablet Take 10 mg by mouth daily. Patient not taking: Reported on 12/20/2022 05/27/22   [provider]  naproxen sodium (ALEVE) 220 MG tablet Take by mouth. Patient not taking: Reported on 06/03/2022    [provider]  QUEtiapine Fumarate (SEROQUEL XR) 150 MG 24  hr tablet Take 150 mg by mouth at bedtime. Patient not taking: Reported on 12/20/2022 05/28/22   [provider]  rivastigmine (EXELON) 1.5 MG capsule Take by mouth. 12/16/19 01/15/20  [provider]  sulfamethoxazole-trimethoprim (BACTRIM DS,SEPTRA DS) 800-160 MG tablet Take 1 tablet by mouth 2 (two) times daily. Patient not taking: Reported on 12/23/2019 09/09/17   Little, Traci M, PA-C  vitamin B-12 (CYANOCOBALAMIN) 500 MCG tablet Take by mouth. Patient not taking: Reported on 12/20/2022    [provider]   CT Head Wo Contrast  Result Date: 12/20/2022 CLINICAL DATA:  Head trauma, minor.  Neck trauma. EXAM: CT HEAD WITHOUT CONTRAST CT  CERVICAL SPINE WITHOUT CONTRAST TECHNIQUE: Multidetector CT imaging of the head and cervical spine was performed following the standard protocol without intravenous contrast. Multiplanar CT image reconstructions of the cervical spine were also generated. RADIATION DOSE REDUCTION: This exam was performed according to the departmental dose-optimization program which includes automated exposure control, adjustment of the mA and/or kV according to patient size and/or use of iterative reconstruction technique. COMPARISON:  CT head and cervical spine 01/29/2021. FINDINGS: CT HEAD FINDINGS Brain: No acute intracranial hemorrhage or infarct. No mass effect or midline shift. Stable patchy hypoattenuation of the periventricular white matter, most consistent with chronic small vessel disease. Stable prominence of the ventricles and sulci. No extra-axial collection. Basilar cisterns are patent. Vascular: No hyperdense vessel. Skull: No calvarial fracture.  Skull base is unremarkable. Sinuses/Orbits: Mild mucosal thickening in the right maxillary sinus. Frothy secretions in the left sphenoid sinus. Orbits are unremarkable. Mastoids and middle ear cavities are well aerated. Other: No soft tissue findings. CT CERVICAL SPINE FINDINGS Alignment: Stable 3 mm anterolisthesis of C4 on C5. Skull base and vertebrae: Stable degenerative changes of the C1-2 level. Normal craniocervical junction. Mild motion artifact through the midcervical spine. No acute fracture. Stable moderate degenerative changes of the lower cervical disc spaces. Heterogeneity of the bones, compatible with osteopenia. Unchanged upper cervical facet arthropathy. Soft tissues and spinal canal: No prevertebral fluid or swelling. No visible canal hematoma. Disc levels: New high-grade spinal canal stenosis. At least moderate neural foraminal narrowing at nearly every level. Upper chest: Biapical pleural-parenchymal scarring. IMPRESSION: 1. No acute intracranial injury. 2.  No acute fracture or traumatic subluxation in the cervical spine. Electronically Signed   By: Emmit Alexanders M.D   On: 12/20/2022 09:13   CT Cervical Spine Wo Contrast  Result Date: 12/20/2022 CLINICAL DATA:  Head trauma, minor.  Neck trauma. EXAM: CT HEAD WITHOUT CONTRAST CT CERVICAL SPINE WITHOUT CONTRAST TECHNIQUE: Multidetector CT imaging of the head and cervical spine was performed following the standard protocol without intravenous contrast. Multiplanar CT image reconstructions of the cervical spine were also generated. RADIATION DOSE REDUCTION: This exam was performed according to the departmental dose-optimization program which includes automated exposure control, adjustment of the mA and/or kV according to patient size and/or use of iterative reconstruction technique. COMPARISON:  CT head and cervical spine 01/29/2021. FINDINGS: CT HEAD FINDINGS Brain: No acute intracranial hemorrhage or infarct. No mass effect or midline shift. Stable patchy hypoattenuation of the periventricular white matter, most consistent with chronic small vessel disease. Stable prominence of the ventricles and sulci. No extra-axial collection. Basilar cisterns are patent. Vascular: No hyperdense vessel. Skull: No calvarial fracture.  Skull base is unremarkable. Sinuses/Orbits: Mild mucosal thickening in the right maxillary sinus. Frothy secretions in the left sphenoid sinus. Orbits are unremarkable. Mastoids and middle ear cavities are well aerated. Other: No soft tissue  findings. CT CERVICAL SPINE FINDINGS Alignment: Stable 3 mm anterolisthesis of C4 on C5. Skull base and vertebrae: Stable degenerative changes of the C1-2 level. Normal craniocervical junction. Mild motion artifact through the midcervical spine. No acute fracture. Stable moderate degenerative changes of the lower cervical disc spaces. Heterogeneity of the bones, compatible with osteopenia. Unchanged upper cervical facet arthropathy. Soft tissues and spinal  canal: No prevertebral fluid or swelling. No visible canal hematoma. Disc levels: New high-grade spinal canal stenosis. At least moderate neural foraminal narrowing at nearly every level. Upper chest: Biapical pleural-parenchymal scarring. IMPRESSION: 1. No acute intracranial injury. 2. No acute fracture or traumatic subluxation in the cervical spine. Electronically Signed   By: Emmit Alexanders M.D   On: 12/20/2022 09:13   DG Hip Unilat W or Wo Pelvis 2-3 Views Right  Result Date: 12/20/2022 CLINICAL DATA:  Fall with pain in the right hip EXAM: DG HIP (WITH OR WITHOUT PELVIS) 3V RIGHT COMPARISON:  None Available. FINDINGS: Fracture: Comminuted mildly displaced intertrochanteric fracture of the right femur. Femoroacetabular joint is maintained. Healing: None. Soft tissue: Normal. IMPRESSION: Comminuted, mildly displaced intertrochanteric fracture of the right femur. Electronically Signed   By: Darrin Nipper M.D.   On: 12/20/2022 08:54   DG Chest 1 View  Result Date: 12/20/2022 CLINICAL DATA:  Fall with right hip fracture. Preoperative respiratory exam. EXAM: CHEST  1 VIEW COMPARISON:  05/06/2019 FINDINGS: Top-normal heart size. Probable component some degree chronic lung disease. There is no evidence of pulmonary edema, consolidation, pneumothorax or pleural fluid. The visualized skeletal structures are unremarkable. IMPRESSION: Top-normal heart size with probable component of chronic lung disease. No acute findings. Electronically Signed   By: Aletta Edouard M.D.   On: 12/20/2022 08:53    Positive ROS: All other systems have been reviewed and were otherwise negative with the exception of those mentioned in the HPI and as above.  Physical Exam: General: Somewhat somnolent, unable to communicate Cardiovascular: No pedal edema Respiratory: No cyanosis, no use of accessory musculature GI: No organomegaly, abdomen is soft and non-tender Skin: No lesions in the area of chief complaint Neurologic: Sensation  intact distally Psychiatric: Patient is competent for consent with normal mood and affect Lymphatic: No axillary or cervical lymphadenopathy  MUSCULOSKELETAL: Tenderness to palpation about the right hip.  Compartments soft. Good cap refill. Motor and sensory intact distally.  Assessment: 89 year old male with Alzheimer's dementia, history of DVT on Eliquis admitted with a comminuted right intertrochanteric hip fracture  Plan: A long discussion with the patient's family, including the medical powers of attorney regarding the nature of the patient's fracture as well as treatment options both nonoperative and operative and risks and benefits of each.  After discussion, the family expressed interest in proceeding with surgery.  Plan for right hip IM nail.    Renee Harder, MD    12/21/2022 7:41 AM

## 2022-12-21 NOTE — Assessment & Plan Note (Signed)
S/p ORIF.  Patient had significant blood loss during surgery. Still under the influence of anesthesia. -Continue with pain management -PT/OT evaluation per orthopedic protocol

## 2022-12-21 NOTE — Assessment & Plan Note (Signed)
-  No acute concern -As needed bronchodilators

## 2022-12-21 NOTE — Progress Notes (Signed)
  Progress Note   Patient: Curtis West [id number] DOB: Oct 24, 1932 DOA: 12/20/2022     1 DOS: the patient was seen and examined on 12/21/2022   Brief hospital course: Taken from H&P.  Mauri Tolen is a 89 y.o. male with medical history significant of hypertension, hyperlipidemia, arthritis, DVT on Eliquis, and Alzheimer's dementia, who presents with unwitnessed fall at memory care unit and right hip pain.  No loss of consciousness.   ED course and data reviewed.  pt was found to have WBC 6.4, GFR> 60, temperature normal, blood pressure 98/48, heart rate 71, RR 16, oxygen saturation 100% on room air.  Chest x-ray negative.  CT of the head negative.  CT of C-spine negative for acute injury.  X-ray of right hip/pelvis showed comminuted, mildly displaced intertrochanteric fracture of the right femur.  He was also having difficulty urinating in ED so Foley catheter was placed.  Orthopedic was consulted and patient will be going to the OR on 1/13.  1/13: Vitals with borderline blood pressure at 95/48, s/p ORIF earlier today.      Assessment and Plan: * Closed right hip fracture (HCC) S/p ORIF.  Patient had significant blood loss during surgery. Still under the influence of anesthesia. -Continue with pain management -PT/OT evaluation per orthopedic protocol  Fall at home, initial encounter -PT/OT evaluation  Normocytic anemia Acute blood loss anemia with surgery. Hemoglobin dropped to 6.8. -Ordered 1 unit of PRBC -Check anemia panel -Monitor hemoglobin  HTN (hypertension) Patient is not on any medications at home.  Borderline soft blood pressure. -Continue to monitor  DVT, lower extremity ([place]) Patient was on Eliquis at home which is currently being switched to IV heparin. -Switch him back to Eliquis once hemoglobin is little more stable.  Asthma without status asthmaticus -No acute concern -As needed bronchodilators  Depression with anxiety -Continue home  meds   Subjective: Patient was seen and examined after the surgery today.  Quite lethargic and somnolent, still under the influence of anesthesia.  Just open eyes momentarily.  2 sons at bedside.  Physical Exam: Vitals:   12/21/22 1030 12/21/22 1059 12/21/22 1220 12/21/22 1238  BP: (!) 97/49 (!) 106/44 (!) 101/53 (!) 101/53  Pulse: 69 71 74 74  Resp: (!) '23 16 17 14  '$ Temp: 97.9 F (36.6 C) 98 F (36.7 C) (!) 97.4 F (36.3 C) (!) 97.4 F (36.3 C)  TempSrc:   Axillary Axillary  SpO2: 97% 91% 93% 93%  Weight:      Height:       General.  Ill-appearing, lethargic elderly man, in no acute distress. Pulmonary.  Lungs clear bilaterally, normal respiratory effort. CV.  Regular rate and rhythm, no JVD, rub or murmur. Abdomen.  Soft, nontender, nondistended, BS positive. CNS.  Somnolent and lethargic, no apparent focal deficit. Extremities.  No edema, no cyanosis, pulses intact and symmetrical. Psychiatry.  Judgment and insight appears impaired  Data Reviewed: Prior data reviewed  Family Communication: Discussed with 2 sons at bedside  Disposition: Status is: Inpatient Remains inpatient appropriate because: Severity of illness  Planned Discharge Destination: Skilled nursing facility  DVT prophylaxis.  Heparin Time spent: 45 minutes  This record has been created using Systems analyst. Errors have been sought and corrected,but may not always be located. Such creation errors do not reflect on the standard of care.   Author: Lorella Nimrod, MD 12/21/2022 12:50 PM  For on call review [email or web address].

## 2022-12-21 NOTE — Assessment & Plan Note (Signed)
Acute blood loss anemia with surgery. Hemoglobin dropped to 6.8. -Ordered 1 unit of PRBC -Check anemia panel -Monitor hemoglobin

## 2022-12-21 NOTE — Plan of Care (Signed)

## 2022-12-21 NOTE — Plan of Care (Signed)
  Problem: Clinical Measurements: Goal: Cardiovascular complication will be avoided Outcome: Progressing   Problem: Nutrition: Goal: Adequate nutrition will be maintained Outcome: Progressing   Problem: Pain Managment: Goal: General experience of comfort will improve Outcome: Progressing   Problem: Education: Goal: Individualized Educational Video(s) Outcome: Progressing   Problem: Self-Concept: Goal: Ability to maintain and perform role responsibilities to the fullest extent possible will improve Outcome: Progressing

## 2022-12-21 NOTE — Progress Notes (Signed)
0550 - Consent taken via telephone to POA listed on the chart. Spoke with Arley Phenix and [facility]. Consent given by Arley Phenix and verified by [employer]. Patient en route to OR, report given to OR RN prior to transport.

## 2022-12-21 NOTE — Assessment & Plan Note (Signed)
Patient was on Eliquis at home which is currently being switched to IV heparin. -Switch him back to Eliquis once hemoglobin is little more stable.

## 2022-12-21 NOTE — Progress Notes (Signed)
Initial Nutrition Assessment  DOCUMENTATION CODES:   Not applicable  INTERVENTION:  Continue regular diet as ordered Ensure Enlive po BID, each supplement provides 350 kcal and 20 grams of protein. MVI with minerals daily  NUTRITION DIAGNOSIS:   Increased nutrient needs related to post-op healing, hip fracture as evidenced by estimated needs.  GOAL:   Patient will meet greater than or equal to 90% of their needs  MONITOR:   PO intake, Supplement acceptance, Labs, Weight trends  REASON FOR ASSESSMENT:   Consult Hip fracture protocol  ASSESSMENT:   Pt admitted from SNF d/t R hip fracture following a fall. PMH significant for HTN, HLD, arthritis, DVT, Alzheimer's dementia.  1/13 - s/p IMN of R hip  Pt noted to be somnolent and lethargic following surgery. Flowsheet documentation reflects pt to be oriented x1.   No documented meal completions on file to review as pt had been NPO for surgery.   RD working remotely. Unable to reach pt via phone call to room.   Unfortunately there is limited weight history on file to review within the last year. Pt's weight seems to have been trending down within the last 2 years as his weight in 2022 was 90.7 kg and his current weight is documented to be 86.2 kg. Uncertain how recent this weight loss may have occurred.  Edema: non-pitting BLE  Given his hip fracture and recent surgical repair, he would likely benefit from the addition of nutrition supplements to optimize his nutritional intake and to support healing. Will place order and adjust as appropriate upon follow up.   Medications: colace (not given), melatonin, NaCl '@75ml'$ /hr, abx  Labs: sodium 134, Ca 7.9  NUTRITION - FOCUSED PHYSICAL EXAM: RD working remotely. Deferred to follow up.   Diet Order:   Diet Order             Diet regular Room service appropriate? Yes; Fluid consistency: Thin  Diet effective now                   EDUCATION NEEDS:   No education needs  have been identified at this time  Skin:  Skin Assessment: Reviewed RN Assessment ((closed) R hip incision)  Last BM:  PTA  Height:   Ht Readings from Last 1 Encounters:  12/20/22 6' (1.829 m)    Weight:   Wt Readings from Last 1 Encounters:  12/20/22 86.2 kg   BMI:  Body mass index is 25.77 kg/m.  Estimated Nutritional Needs:   Kcal:  2100-2300  Protein:  105-120g  Fluid:  >/=2L  Clayborne Dana, RDN, LDN Clinical Nutrition

## 2022-12-21 NOTE — Plan of Care (Signed)
  Problem: Clinical Measurements: Goal: Respiratory complications will improve 12/21/2022 1254 by Piper Hassebrock A, LPN Outcome: Progressing 12/21/2022 1154 by Nolton Denis A, LPN Outcome: Progressing   Problem: Clinical Measurements: Goal: Cardiovascular complication will be avoided 12/21/2022 1254 by Brysun Eschmann A, LPN Outcome: Progressing 12/21/2022 1154 by Devine Dant A, LPN Outcome: Progressing   Problem: Activity: Goal: Risk for activity intolerance will decrease 12/21/2022 1254 by Nester Bachus A, LPN Outcome: Progressing 12/21/2022 1154 by Mykenzi Vanzile A, LPN Outcome: Progressing   Problem: Pain Managment: Goal: General experience of comfort will improve 12/21/2022 1254 by Mayco Walrond A, LPN Outcome: Progressing 12/21/2022 1154 by Rayner Erman A, LPN Outcome: Progressing   Problem: Education: Goal: Individualized Educational Video(s) 12/21/2022 1254 by Evonne Rinks A, LPN Outcome: Progressing 12/21/2022 1154 by Braylee Lal A, LPN Outcome: Progressing   Problem: Pain Management: Goal: Pain level will decrease 12/21/2022 1254 by Heather Streeper A, LPN Outcome: Progressing 12/21/2022 1154 by Willena Jeancharles A, LPN Outcome: Progressing

## 2022-12-21 NOTE — Discharge Instructions (Signed)
Orthopedic discharge instructions: Patient has had a right hip intramedullary nail Patient to be full weightbearing on the operative extremity until follow-up Dressing changes as needed DVT prophylaxis includes home Eliquis dose Follow up with Dr. Minda Meo PA, Murilo Zanette PA-C at [facility], [place] office in 10-14 days for wound check, staple removal and x-ray.

## 2022-12-21 NOTE — Hospital Course (Addendum)
Taken from H&P.  Curtis West is a 89 y.o. male with medical history significant of hypertension, hyperlipidemia, arthritis, DVT on Eliquis, and Alzheimer's dementia, who presents with unwitnessed fall at memory care unit and right hip pain.  No loss of consciousness.   ED course and data reviewed.  pt was found to have WBC 6.4, GFR> 60, temperature normal, blood pressure 98/48, heart rate 71, RR 16, oxygen saturation 100% on room air.  Chest x-ray negative.  CT of the head negative.  CT of C-spine negative for acute injury.  X-ray of right hip/pelvis showed comminuted, mildly displaced intertrochanteric fracture of the right femur.  He was also having difficulty urinating in ED so Foley catheter was placed.  Orthopedic was consulted and patient will be going to the OR on 1/13.  1/13: Vitals with borderline blood pressure at 95/48, s/p ORIF earlier today.  Patient had significant blood loss during surgery and hemoglobin dropped to 6.8.  1 unit of PRBC ordered.  1/14: Vitals within normal limit.  Hemoglobin at 7.6 after improving to 8.8 s/p 1 unit of PRBC. Leukocytosis has been resolved.  Will hold Eliquis for another day, most likely will resume from tomorrow if hemoglobin remained stable.  Also starting him on iron supplement No prior history of urinary retention so Foley catheter will be removed today to give him a voiding trial.  PT is recommending SNF.  Son was very concerned that he will not do good if was sent somewhere new without his wife, he and his wife apparently living together in a memory care unit. Asked TOC to find out if his current memory care unit can provide PT and OT so he can remain in the prior setting.  1/15: Vitals stable.  Hemoglobin with further decreased to 6.7, sodium with persistent mild hyponatremia at 132.  Orthopedic surgery ordered 1 more unit of PRBC. We will keep holding Eliquis until hemoglobin stabilizes. Anemia panel with iron deficiency, borderline folate  and B12 pending.  Increasing the frequency of supplement to twice daily.

## 2022-12-21 NOTE — Op Note (Signed)
DATE OF SURGERY:  12/21/2022  TIME: 9:40 AM  PATIENT NAME:  Curtis West  AGE: 89 y.o.  PRE-OPERATIVE DIAGNOSIS:  right intertrochanteric hip fracture  POST-OPERATIVE DIAGNOSIS:  SAME  PROCEDURE: Right INTRAMEDULLARY (IM) NAIL INTERTROCHANTERIC  SURGEON:  Renee Harder  EBL:  564 cc  COMPLICATIONS: None  OPERATIVE IMPLANTS: [employer] Intertan femoral nail 11.5 mm x 440 mm  PREOPERATIVE INDICATIONS:  [name] is a 89 y.o. year old who fell and suffered a hip fracture. He was brought into the ER and then admitted and optimized and then elected for surgical intervention.    The risks benefits and alternatives were discussed with the patient including but not limited to the risks of nonoperative treatment, versus surgical intervention including infection, bleeding, nerve injury, malunion, nonunion, hardware prominence, hardware failure, need for hardware removal, blood clots, cardiopulmonary complications, morbidity, mortality, among others, and they were willing to proceed.    OPERATIVE PROCEDURE:  The patient was brought to the operating room and placed in the supine position.  General anesthesia was administered, with a foley. He was placed on the fracture table.  Closed reduction was performed under C-arm guidance. The length of the femur was also measured using fluoroscopy. Time out was then performed after sterile prep and drape. He received preoperative antibiotics.  Incision was made proximal to the greater trochanter. A guidewire was placed in the appropriate position. Confirmation was made on AP and lateral views. The above-named nail was opened. I opened the proximal femur with a reamer.  I then placed a ball-tipped guidewire and reamed the length of the femur until appropriate chatter. I then placed the nail by hand easily down.  Once the nail was completely seated, I placed a guidepin into the femoral head into the center center position through a second  incision.  I measured the length, and then reamed the lateral cortex and up into the head. I then placed the two interlocking lag screws. Slight compression was applied. Anatomic fixation achieved. Bone quality was poor.  I then secured the proximal interlocking screws.  I then placed 2 distal interlocking screws using perfect circle technique.  I then removed the instruments, and took final C-arm pictures AP and lateral the entire length of the leg. Anatomic reconstruction was achieved, and the wounds were irrigated copiously and closed with Vicryl  followed by staples and dry sterile dressing. Sponge and needle count were correct.   The patient was awakened and returned to PACU in stable and satisfactory condition. There no complications and the patient tolerated the procedure well.   POSTOPERATIVE PLAN: He will be weightbearing as tolerated.   Ok to resume DVT ppx POD#1 - Eliquis Dressing change by nursing staff as needed to keep dressing clean and dry Outpatient f/u in clinic in 2 weeks for staple removal and xrays  Renee Harder

## 2022-12-22 DIAGNOSIS — S72001A Fracture of unspecified part of neck of right femur, initial encounter for closed fracture: Secondary | ICD-10-CM | POA: Diagnosis not present

## 2022-12-22 LAB — CBC
HCT: 24.1 % — ABNORMAL LOW (ref 39.0–52.0)
Hemoglobin: 7.6 g/dL — ABNORMAL LOW (ref 13.0–17.0)
MCH: 25.8 pg — ABNORMAL LOW (ref 26.0–34.0)
MCHC: 31.5 g/dL (ref 30.0–36.0)
MCV: 81.7 fL (ref 80.0–100.0)
Platelets: 146 10*3/uL — ABNORMAL LOW (ref 150–400)
RBC: 2.95 MIL/uL — ABNORMAL LOW (ref 4.22–5.81)
RDW: 14.5 % (ref 11.5–15.5)
WBC: 8.8 10*3/uL (ref 4.0–10.5)
nRBC: 0 % (ref 0.0–0.2)

## 2022-12-22 LAB — BASIC METABOLIC PANEL
Anion gap: 4 — ABNORMAL LOW (ref 5–15)
BUN: 20 mg/dL (ref 8–23)
CO2: 24 mmol/L (ref 22–32)
Calcium: 7.8 mg/dL — ABNORMAL LOW (ref 8.9–10.3)
Chloride: 105 mmol/L (ref 98–111)
Creatinine, Ser: 1.08 mg/dL (ref 0.61–1.24)
GFR, Estimated: >60 mL/min (ref >60)
Glucose, Bld: 116 mg/dL — ABNORMAL HIGH (ref 70–99)
Potassium: 3.9 mmol/L (ref 3.5–5.1)
Sodium: 133 mmol/L — ABNORMAL LOW (ref 135–145)

## 2022-12-22 MED ORDER — FE FUM-VIT C-VIT B12-FA 460-60-0.01-1 MG PO CAPS
1.0000 | ORAL_CAPSULE | Freq: Every day | ORAL | Status: DC
  Administered 2022-12-22 – 2022-12-23 (×2): 1 via ORAL
  Filled 2022-12-22 (×2): qty 1

## 2022-12-22 MED ORDER — CHLORHEXIDINE GLUCONATE CLOTH 2 % EX PADS
6.0000 | MEDICATED_PAD | Freq: Every day | CUTANEOUS | Status: DC
  Administered 2022-12-22 – 2022-12-23 (×2): 6 via TOPICAL

## 2022-12-22 NOTE — Progress Notes (Signed)
Progress Note   Patient: Curtis West DXA:[id number] DOB: 1932-12-09 DOA: 12/20/2022     2 DOS: the patient was seen and examined on 12/22/2022   Brief hospital course: Taken from H&P.  Curtis West is a 89 y.o. male with medical history significant of hypertension, hyperlipidemia, arthritis, DVT on Eliquis, and Alzheimer's dementia, who presents with unwitnessed fall at memory care unit and right hip pain.  No loss of consciousness.   ED course and data reviewed.  pt was found to have WBC 6.4, GFR> 60, temperature normal, blood pressure 98/48, heart rate 71, RR 16, oxygen saturation 100% on room air.  Chest x-ray negative.  CT of the head negative.  CT of C-spine negative for acute injury.  X-ray of right hip/pelvis showed comminuted, mildly displaced intertrochanteric fracture of the right femur.  He was also having difficulty urinating in ED so Foley catheter was placed.  Orthopedic was consulted and patient will be going to the OR on 1/13.  1/13: Vitals with borderline blood pressure at 95/48, s/p ORIF earlier today.  Patient had significant blood loss during surgery and hemoglobin dropped to 6.8.  1 unit of PRBC ordered.  1/14: Vitals within normal limit.  Hemoglobin at 7.6 after improving to 8.8 s/p 1 unit of PRBC. Leukocytosis has been resolved.  Will hold Eliquis for another day, most likely will resume from tomorrow if hemoglobin remained stable.  Also starting him on iron supplement No prior history of urinary retention so Foley catheter will be removed today to give him a voiding trial.  PT is recommending SNF.  Son was very concerned that he will not do good if was sent somewhere new without his wife, he and his wife apparently living together in a memory care unit. Asked TOC to find out if his current memory care unit can provide PT and OT so he can remain in the prior setting.   Assessment and Plan: * Closed right hip fracture (HCC) S/p ORIF.  Patient had significant  blood loss during surgery. Clinically seems improving.  PT is recommending SNF -Continue with pain management   Fall at home, initial encounter -PT/OT evaluation  Normocytic anemia Acute blood loss anemia with surgery. Hemoglobin dropped to 6.8 postsurgically, s/p 1 unit of PRBC.  Hemoglobin at 7.6 this morning. -Starting him on supplement -Monitor hemoglobin -Transfuse if below 7  HTN (hypertension) Patient is not on any medications at home.  Blood pressure within goal -Continue to monitor  DVT, lower extremity (Curtis West) Patient was on Eliquis at home which is currently being held, initially for surgery. -Keep holding Eliquis for another day, can resume tomorrow if hemoglobin remained stable.  Asthma without status asthmaticus -No acute concern -As needed bronchodilators  Depression with anxiety -Continue home meds   Subjective: Patient was sitting in bed and eating breakfast when seen today.  Feeling much improved and pain seems well-controlled with current regimen.  No prior difficulty with urination per son.  Patient and his wife both living together at the memory care unit.  Physical Exam: Vitals:   12/21/22 2048 12/21/22 2324 12/22/22 0458 12/22/22 0954  BP: 123/62 113/60 121/60 (!) 109/57  Pulse: 77 76 77 82  Resp: '16 17 16 17  '$ Temp: 98.8 F (37.1 C) 99 F (37.2 C) 98.6 F (37 C) 98.6 F (37 C)  TempSrc:      SpO2: 92% 97% 90% 97%  Weight:      Height:       General.  Frail elderly  man, in no acute distress. Pulmonary.  Lungs clear bilaterally, normal respiratory effort. CV.  Regular rate and rhythm, no JVD, rub or murmur. Abdomen.  Soft, nontender, nondistended, BS positive. CNS.  Alert and oriented .  No focal neurologic deficit. Extremities.  No edema, no cyanosis, pulses intact and symmetrical. Psychiatry.  Judgment and insight appears impaired.  Data Reviewed: Prior data reviewed  Family Communication: Discussed with son and wife at  bedside  Disposition: Status is: Inpatient Remains inpatient appropriate because: Severity of illness  Planned Discharge Destination: Skilled nursing facility  DVT prophylaxis.  Heparin Time spent: 42 minutes  This record has been created using Systems analyst. Errors have been sought and corrected,but may not always be located. Such creation errors do not reflect on the standard of care.   Author: Lorella Nimrod, MD 12/22/2022 12:20 PM  For on call review [email or web address].

## 2022-12-22 NOTE — Assessment & Plan Note (Signed)
S/p ORIF.  Patient had significant blood loss during surgery. Clinically seems improving.  PT is recommending SNF -Continue with pain management

## 2022-12-22 NOTE — Plan of Care (Signed)

## 2022-12-22 NOTE — Plan of Care (Signed)
  Problem: Clinical Measurements: Goal: Ability to maintain clinical measurements within normal limits will improve Outcome: Progressing   Problem: Clinical Measurements: Goal: Will remain free from infection Outcome: Progressing   Problem: Clinical Measurements: Goal: Diagnostic test results will improve Outcome: Progressing   Problem: Clinical Measurements: Goal: Respiratory complications will improve Outcome: Progressing   Problem: Clinical Measurements: Goal: Cardiovascular complication will be avoided Outcome: Progressing   Problem: Pain Management: Goal: Pain level will decrease Outcome: Progressing

## 2022-12-22 NOTE — Assessment & Plan Note (Signed)
Patient is not on any medications at home.  Blood pressure within goal -Continue to monitor

## 2022-12-22 NOTE — Progress Notes (Signed)
Subjective:  Patient appears to be resting comfortably in bed.  He has dementia and is unable to provide history.  Patient was given 1 unit PRBC for hematocrit 22 yesterday afternoon.  Hematocrit is 24.1  this morning.   Objective:   VITALS:   Vitals:   12/21/22 1600 12/21/22 2048 12/21/22 2324 12/22/22 0458  BP: 117/67 123/62 113/60 121/60  Pulse: 72 77 76 77  Resp: '16 16 17 16  '$ Temp: 99.8 F (37.7 C) 98.8 F (37.1 C) 99 F (37.2 C) 98.6 F (37 C)  TempSrc: Axillary     SpO2: 92% 92% 97% 90%  Weight:      Height:        PHYSICAL EXAM:  General: In no acute distress Lower extremity: Dressings are clean, dry, intact, compartments are soft, grossly motor and sensory intact  LABS  Results for orders placed or performed during the hospital encounter of 12/20/22 (from the past 24 hour(s))  CBC     Status: Abnormal   Collection Time: 12/21/22 11:13 AM  Result Value Ref Range   WBC 9.7 4.0 - 10.5 K/uL   RBC 2.73 (L) 4.22 - 5.81 MIL/uL   Hemoglobin 6.8 (L) 13.0 - 17.0 g/dL   HCT 22.5 (L) 39.0 - 52.0 %   MCV 82.4 80.0 - 100.0 fL   MCH 24.9 (L) 26.0 - 34.0 pg   MCHC 30.2 30.0 - 36.0 g/dL   RDW 14.4 11.5 - 15.5 %   Platelets 174 150 - 400 K/uL   nRBC 0.0 0.0 - 0.2 %  ABO/Rh     Status: None   Collection Time: 12/21/22 11:13 AM  Result Value Ref Range   ABO/RH(D)      O NEG Performed at [facility], [address] [place], [place] [address]   Prepare RBC (crossmatch)     Status: None   Collection Time: 12/21/22 11:52 AM  Result Value Ref Range   Order Confirmation      ORDER PROCESSED BY BLOOD BANK Performed at [facility], [address] [place], [place] [address]   CBC     Status: Abnormal   Collection Time: 12/21/22  5:07 PM  Result Value Ref Range   WBC 11.9 (H) 4.0 - 10.5 K/uL   RBC 3.36 (L) 4.22 - 5.81 MIL/uL   Hemoglobin 8.8 (L) 13.0 - 17.0 g/dL   HCT 27.7 (L) 39.0 - 52.0 %   MCV 82.4 80.0 - 100.0 fL   MCH 26.2 26.0 - 34.0 pg    MCHC 31.8 30.0 - 36.0 g/dL   RDW 14.2 11.5 - 15.5 %   Platelets 166 150 - 400 K/uL   nRBC 0.0 0.0 - 0.2 %  CBC     Status: Abnormal   Collection Time: 12/21/22 10:21 PM  Result Value Ref Range   WBC 9.6 4.0 - 10.5 K/uL   RBC 2.91 (L) 4.22 - 5.81 MIL/uL   Hemoglobin 7.6 (L) 13.0 - 17.0 g/dL   HCT 24.2 (L) 39.0 - 52.0 %   MCV 83.2 80.0 - 100.0 fL   MCH 26.1 26.0 - 34.0 pg   MCHC 31.4 30.0 - 36.0 g/dL   RDW 14.3 11.5 - 15.5 %   Platelets 148 (L) 150 - 400 K/uL   nRBC 0.0 0.0 - 0.2 %  CBC     Status: Abnormal   Collection Time: 12/22/22  5:45 AM  Result Value Ref Range   WBC 8.8 4.0 - 10.5 K/uL  RBC 2.95 (L) 4.22 - 5.81 MIL/uL   Hemoglobin 7.6 (L) 13.0 - 17.0 g/dL   HCT 24.1 (L) 39.0 - 52.0 %   MCV 81.7 80.0 - 100.0 fL   MCH 25.8 (L) 26.0 - 34.0 pg   MCHC 31.5 30.0 - 36.0 g/dL   RDW 14.5 11.5 - 15.5 %   Platelets 146 (L) 150 - 400 K/uL   nRBC 0.0 0.0 - 0.2 %  Basic metabolic panel     Status: Abnormal   Collection Time: 12/22/22  5:45 AM  Result Value Ref Range   Sodium 133 (L) 135 - 145 mmol/L   Potassium 3.9 3.5 - 5.1 mmol/L   Chloride 105 98 - 111 mmol/L   CO2 24 22 - 32 mmol/L   Glucose, Bld 116 (H) 70 - 99 mg/dL   BUN 20 8 - 23 mg/dL   Creatinine, Ser 1.08 0.61 - 1.24 mg/dL   Calcium 7.8 (L) 8.9 - 10.3 mg/dL   GFR, Estimated >60 >60 mL/min   Anion gap 4 (L) 5 - 15    DG FEMUR, MIN 2 VIEWS RIGHT  Result Date: 12/21/2022 CLINICAL DATA:  Status post internal fixation of right hip fracture. EXAM: RIGHT FEMUR 2 VIEWS COMPARISON:  None Available. FINDINGS: Right hip screws and femoral intramedullary rod with distal fixation screws are seen transfixing an intertrochanteric right hip fracture in near anatomic alignment. No evidence of dislocation. Knee prosthesis also noted. IMPRESSION: Internal fixation of intertrochanteric right hip fracture in near anatomic alignment. Electronically Signed   By: Marlaine Hind M.D.   On: 12/21/2022 10:20   DG C-Arm 1-60 Min-No  Report  Result Date: 12/21/2022 Fluoroscopy was utilized by the requesting physician.  No radiographic interpretation.   DG C-Arm 1-60 Min-No Report  Result Date: 12/21/2022 Fluoroscopy was utilized by the requesting physician.  No radiographic interpretation.   CT Head Wo Contrast  Result Date: 12/20/2022 CLINICAL DATA:  Head trauma, minor.  Neck trauma. EXAM: CT HEAD WITHOUT CONTRAST CT CERVICAL SPINE WITHOUT CONTRAST TECHNIQUE: Multidetector CT imaging of the head and cervical spine was performed following the standard protocol without intravenous contrast. Multiplanar CT image reconstructions of the cervical spine were also generated. RADIATION DOSE REDUCTION: This exam was performed according to the departmental dose-optimization program which includes automated exposure control, adjustment of the mA and/or kV according to patient size and/or use of iterative reconstruction technique. COMPARISON:  CT head and cervical spine 01/29/2021. FINDINGS: CT HEAD FINDINGS Brain: No acute intracranial hemorrhage or infarct. No mass effect or midline shift. Stable patchy hypoattenuation of the periventricular white matter, most consistent with chronic small vessel disease. Stable prominence of the ventricles and sulci. No extra-axial collection. Basilar cisterns are patent. Vascular: No hyperdense vessel. Skull: No calvarial fracture.  Skull base is unremarkable. Sinuses/Orbits: Mild mucosal thickening in the right maxillary sinus. Frothy secretions in the left sphenoid sinus. Orbits are unremarkable. Mastoids and middle ear cavities are well aerated. Other: No soft tissue findings. CT CERVICAL SPINE FINDINGS Alignment: Stable 3 mm anterolisthesis of C4 on C5. Skull base and vertebrae: Stable degenerative changes of the C1-2 level. Normal craniocervical junction. Mild motion artifact through the midcervical spine. No acute fracture. Stable moderate degenerative changes of the lower cervical disc spaces.  Heterogeneity of the bones, compatible with osteopenia. Unchanged upper cervical facet arthropathy. Soft tissues and spinal canal: No prevertebral fluid or swelling. No visible canal hematoma. Disc levels: New high-grade spinal canal stenosis. At least moderate neural foraminal narrowing at nearly  every level. Upper chest: Biapical pleural-parenchymal scarring. IMPRESSION: 1. No acute intracranial injury. 2. No acute fracture or traumatic subluxation in the cervical spine. Electronically Signed   By: Emmit Alexanders M.D   On: 12/20/2022 09:13   CT Cervical Spine Wo Contrast  Result Date: 12/20/2022 CLINICAL DATA:  Head trauma, minor.  Neck trauma. EXAM: CT HEAD WITHOUT CONTRAST CT CERVICAL SPINE WITHOUT CONTRAST TECHNIQUE: Multidetector CT imaging of the head and cervical spine was performed following the standard protocol without intravenous contrast. Multiplanar CT image reconstructions of the cervical spine were also generated. RADIATION DOSE REDUCTION: This exam was performed according to the departmental dose-optimization program which includes automated exposure control, adjustment of the mA and/or kV according to patient size and/or use of iterative reconstruction technique. COMPARISON:  CT head and cervical spine 01/29/2021. FINDINGS: CT HEAD FINDINGS Brain: No acute intracranial hemorrhage or infarct. No mass effect or midline shift. Stable patchy hypoattenuation of the periventricular white matter, most consistent with chronic small vessel disease. Stable prominence of the ventricles and sulci. No extra-axial collection. Basilar cisterns are patent. Vascular: No hyperdense vessel. Skull: No calvarial fracture.  Skull base is unremarkable. Sinuses/Orbits: Mild mucosal thickening in the right maxillary sinus. Frothy secretions in the left sphenoid sinus. Orbits are unremarkable. Mastoids and middle ear cavities are well aerated. Other: No soft tissue findings. CT CERVICAL SPINE FINDINGS Alignment: Stable  3 mm anterolisthesis of C4 on C5. Skull base and vertebrae: Stable degenerative changes of the C1-2 level. Normal craniocervical junction. Mild motion artifact through the midcervical spine. No acute fracture. Stable moderate degenerative changes of the lower cervical disc spaces. Heterogeneity of the bones, compatible with osteopenia. Unchanged upper cervical facet arthropathy. Soft tissues and spinal canal: No prevertebral fluid or swelling. No visible canal hematoma. Disc levels: New high-grade spinal canal stenosis. At least moderate neural foraminal narrowing at nearly every level. Upper chest: Biapical pleural-parenchymal scarring. IMPRESSION: 1. No acute intracranial injury. 2. No acute fracture or traumatic subluxation in the cervical spine. Electronically Signed   By: Emmit Alexanders M.D   On: 12/20/2022 09:13   DG Hip Unilat W or Wo Pelvis 2-3 Views Right  Result Date: 12/20/2022 CLINICAL DATA:  Fall with pain in the right hip EXAM: DG HIP (WITH OR WITHOUT PELVIS) 3V RIGHT COMPARISON:  None Available. FINDINGS: Fracture: Comminuted mildly displaced intertrochanteric fracture of the right femur. Femoroacetabular joint is maintained. Healing: None. Soft tissue: Normal. IMPRESSION: Comminuted, mildly displaced intertrochanteric fracture of the right femur. Electronically Signed   By: Darrin Nipper M.D.   On: 12/20/2022 08:54   DG Chest 1 View  Result Date: 12/20/2022 CLINICAL DATA:  Fall with right hip fracture. Preoperative respiratory exam. EXAM: CHEST  1 VIEW COMPARISON:  05/06/2019 FINDINGS: Top-normal heart size. Probable component some degree chronic lung disease. There is no evidence of pulmonary edema, consolidation, pneumothorax or pleural fluid. The visualized skeletal structures are unremarkable. IMPRESSION: Top-normal heart size with probable component of chronic lung disease. No acute findings. Electronically Signed   By: Aletta Edouard M.D.   On: 12/20/2022 08:53    Assessment/Plan: 1  Day Post-Op  Status post right hip IM nail  Principal Problem:   Closed right hip fracture (HCC) Active Problems:   Asthma without status asthmaticus   Normocytic anemia   DVT, lower extremity ([place])   Fall at home, initial encounter   HTN (hypertension)   Depression with anxiety   -Appreciate hospitalist team management -Continue to monitor hemoglobin/hematocrit -PT/OT: Weight-bear as tolerated  right lower extremity -DVT prophylaxis: Okay to resume Eliquis.  Patient did not have a significant amount of blood loss with surgery yesterday, but given recent hematocrit, may consider holding Eliquis until improvement in acute postoperative anemia. -Okay to remove Foley -Discharge pending PT and case management   Renee Harder , MD 12/22/2022, 7:51 AM

## 2022-12-22 NOTE — Evaluation (Signed)
Physical Therapy Evaluation Patient Details Name: Curtis West MRN: [id number] DOB: 1932-03-17 Today's Date: 12/22/2022  History of Present Illness  Patient is a 89 year old male with closed right hip fracture s/p IM nail following a fall at memory care unit. History of Alzheimer's dementia, HTN, arthritis, DVT.  Clinical Impression  Patient in bed on arrival to room. No family at the bedside initially. The patient is from a memory care unit where he is ambulatory at baseline.  The patient is unable to tell me his name or birthday today. He required multi-modal cues and inconsistently following single step commands with activity. He has pain in the right hip with movement. Significant assistance is required for bed mobility. Patient unable to stand with +2 person assistance due to pain and likely also due to impaired cognition. Recommend to continue PT to maximize independence and facilitate return to prior level of function. If the memory care unit is unable to  provide increased physical assistance with mobility, SNF will be recommended. PT will continue to follow in attempts to maximize independence and decrease caregiver burden.      Recommendations for follow up therapy are one component of a multi-disciplinary discharge planning process, led by the attending physician.  Recommendations may be updated based on patient status, additional functional criteria and insurance authorization.  Follow Up Recommendations Skilled nursing-short term rehab (<3 hours/day) Can patient physically be transported by private vehicle: No    Assistance Recommended at Discharge Frequent or constant Supervision/Assistance  Patient can return home with the following  Two people to help with walking and/or transfers;A lot of help with bathing/dressing/bathroom;Help with stairs or ramp for entrance;Assist for transportation    Equipment Recommendations  (to be determined)  Recommendations for Other Services        Functional Status Assessment Patient has had a recent decline in their functional status and demonstrates the ability to make significant improvements in function in a reasonable and predictable amount of time.     Precautions / Restrictions Precautions Precautions: Fall Restrictions Weight Bearing Restrictions: Yes RLE Weight Bearing: Weight bearing as tolerated      Mobility  Bed Mobility Overal bed mobility: Needs Assistance Bed Mobility: Supine to Sit, Sit to Supine     Supine to sit: Max assist, HOB elevated Sit to supine: Max assist, +2 for physical assistance   General bed mobility comments: assistance for trunk and BLE support. maximal cues for sequencing and technique. increased time and effort required and patient often stating "go slow, go slow" and "help me"    Transfers Overall transfer level: Needs assistance Equipment used: Rolling walker (2 wheels) Transfers: Sit to/from Stand             General transfer comment: 2 bouts of standing attempted from the bed with bed height elevated and maximal cues for technique. patient unable to stand at this time, partially limited by pain and cognition    Ambulation/Gait               General Gait Details: unable to at this time  Stairs            Wheelchair Mobility    Modified Rankin (Stroke Patients Only)       Balance Overall balance assessment: Needs assistance Sitting-balance support: Feet supported Sitting balance-Leahy Scale: Poor Sitting balance - Comments: left lean preferred for off loading right hip in sitting. maximal assistance required to achieve midline sitting balance initially. progressed to fair sitting balance with increased  time Postural control: Left lateral lean                                   Pertinent Vitals/Pain Pain Assessment Pain Assessment: Faces Faces Pain Scale: Hurts even more Pain Location: R hip with movement Pain Descriptors /  Indicators: Grimacing, Discomfort Pain Intervention(s): Limited activity within patient's tolerance, Monitored during session, Repositioned, Ice applied    Home Living Family/patient expects to be discharged to:: Assisted living (Memory care facility)                        Prior Function Prior Level of Function : Needs assist             Mobility Comments: patient is independent with ambulation using a 4 wheeled walker per report ADLs Comments: presumably patient requires at least supervision/set-up assistance     Hand Dominance        Extremity/Trunk Assessment   Upper Extremity Assessment Upper Extremity Assessment: Overall WFL for tasks assessed    Lower Extremity Assessment Lower Extremity Assessment: RLE deficits/detail RLE Deficits / Details: patient unable to follow commands for formal MMT. patient able to activate hip/knee/ankle movement RLE: Unable to fully assess due to pain (and cognition)       Communication      Cognition Arousal/Alertness: Awake/alert Behavior During Therapy: Flat affect Overall Cognitive Status: History of cognitive impairments - at baseline                                 General Comments: patient unable to tell me his name or birthdate, patient needs encouragement, increased time, and multi-modal cues to follow single step commands intermittently        General Comments      Exercises     Assessment/Plan    PT Assessment Patient needs continued PT services  PT Problem List Decreased strength;Decreased range of motion;Decreased activity tolerance;Decreased balance;Decreased mobility;Decreased cognition;Decreased knowledge of use of DME;Decreased safety awareness;Decreased knowledge of precautions;Pain       PT Treatment Interventions DME instruction;Gait training;Stair training;Functional mobility training;Therapeutic activities;Therapeutic exercise;Balance training;Neuromuscular re-education;Cognitive  remediation;Patient/family education;Wheelchair mobility training    PT Goals (Current goals can be found in the Care Plan section)  Acute Rehab PT Goals Patient Stated Goal: none stated PT Goal Formulation: Patient unable to participate in goal setting Time For Goal Achievement: 01/05/23 Potential to Achieve Goals: Poor    Frequency 7X/week     Co-evaluation               AM-PAC PT "6 Clicks" Mobility  Outcome Measure Help needed turning from your back to your side while in a flat bed without using bedrails?: A Lot Help needed moving from lying on your back to sitting on the side of a flat bed without using bedrails?: A Lot Help needed moving to and from a bed to a chair (including a wheelchair)?: Total Help needed standing up from a chair using your arms (e.g., wheelchair or bedside chair)?: Total Help needed to walk in hospital room?: Total Help needed climbing 3-5 steps with a railing? : Total 6 Click Score: 8    End of Session   Activity Tolerance: Patient limited by pain Patient left: in bed;with call bell/phone within reach;with bed alarm set;with nursing/sitter in room;with family/visitor present (ice pack applied to right  hip for pain) Nurse Communication: Mobility status PT Visit Diagnosis: Difficulty in walking, not elsewhere classified (R26.2);Pain Pain - Right/Left: Right Pain - part of body: Hip    Time: 1010-1034 PT Time Calculation (min) (ACUTE ONLY): 24 min   Charges:   PT Evaluation $PT Eval Low Complexity: 1 Low PT Treatments $Therapeutic Activity: 8-22 mins       Minna Merritts, PT, MPT   Percell Locus 12/22/2022, 11:26 AM

## 2022-12-22 NOTE — Assessment & Plan Note (Signed)
Acute blood loss anemia with surgery. Hemoglobin dropped to 6.8 postsurgically, s/p 1 unit of PRBC.  Hemoglobin at 7.6 this morning. -Starting him on supplement -Monitor hemoglobin -Transfuse if below 7

## 2022-12-22 NOTE — TOC Progression Note (Signed)
Transition of Care [facility]) - Progression Note    Patient Details  Name: Tarik Teixeira MRN: [id number] Date of Birth: 1932-11-23  [facility] of [facility]) CM/SW Contact  Izola Price, RN Phone Number: 12/22/2022, 4:41 PM  Clinical Narrative: 1/14: POD#1 S/P Right hip fax OR after unwitnessed fall at [facility] memory care where resides with wife and wants to be able to stay with wife wherever discharged to per son. She calms him down. Foley placed for retention will void trial today.   Update: 57 pm.Spoke with son and patient reportedly is in a lot of pain and unable to void. Told him TOC would follow with DC plan, hopefully back to [facility] where patient has PT before. Simmie Davies RN CM             Expected Discharge Plan and Services                                               Social Determinants of Health (SDOH) Interventions SDOH Screenings   Tobacco Use: Medium Risk (12/21/2022)    Readmission Risk Interventions     No data to display

## 2022-12-22 NOTE — Assessment & Plan Note (Signed)
Patient was on Eliquis at home which is currently being held, initially for surgery. -Keep holding Eliquis for another day, can resume tomorrow if hemoglobin remained stable.

## 2022-12-23 ENCOUNTER — Encounter: Payer: Self-pay | Admitting: Orthopaedic Surgery

## 2022-12-23 DIAGNOSIS — E440 Moderate protein-calorie malnutrition: Secondary | ICD-10-CM | POA: Insufficient documentation

## 2022-12-23 DIAGNOSIS — S72001A Fracture of unspecified part of neck of right femur, initial encounter for closed fracture: Secondary | ICD-10-CM | POA: Diagnosis not present

## 2022-12-23 LAB — BASIC METABOLIC PANEL
Anion gap: 4 — ABNORMAL LOW (ref 5–15)
BUN: 20 mg/dL (ref 8–23)
CO2: 23 mmol/L (ref 22–32)
Calcium: 7.7 mg/dL — ABNORMAL LOW (ref 8.9–10.3)
Chloride: 105 mmol/L (ref 98–111)
Creatinine, Ser: 1.1 mg/dL (ref 0.61–1.24)
GFR, Estimated: >60 mL/min (ref >60)
Glucose, Bld: 117 mg/dL — ABNORMAL HIGH (ref 70–99)
Potassium: 4.1 mmol/L (ref 3.5–5.1)
Sodium: 132 mmol/L — ABNORMAL LOW (ref 135–145)

## 2022-12-23 LAB — CBC
HCT: 20.8 % — ABNORMAL LOW (ref 39.0–52.0)
Hemoglobin: 6.7 g/dL — ABNORMAL LOW (ref 13.0–17.0)
MCH: 26.1 pg (ref 26.0–34.0)
MCHC: 32.2 g/dL (ref 30.0–36.0)
MCV: 80.9 fL (ref 80.0–100.0)
Platelets: 156 10*3/uL (ref 150–400)
RBC: 2.57 MIL/uL — ABNORMAL LOW (ref 4.22–5.81)
RDW: 14.8 % (ref 11.5–15.5)
WBC: 7.5 10*3/uL (ref 4.0–10.5)
nRBC: 0 % (ref 0.0–0.2)

## 2022-12-23 LAB — RETICULOCYTES
Immature Retic Fract: 26.8 % — ABNORMAL HIGH (ref 2.3–15.9)
RBC.: 2.59 MIL/uL — ABNORMAL LOW (ref 4.22–5.81)
Retic Count, Absolute: 58 10*3/uL (ref 19.0–186.0)
Retic Ct Pct: 2.2 % (ref 0.4–3.1)

## 2022-12-23 LAB — HEMOGLOBIN AND HEMATOCRIT, BLOOD
HCT: 25.3 % — ABNORMAL LOW (ref 39.0–52.0)
Hemoglobin: 8.2 g/dL — ABNORMAL LOW (ref 13.0–17.0)

## 2022-12-23 LAB — FOLATE: Folate: 8.1 ng/mL (ref >5.9)

## 2022-12-23 LAB — FERRITIN: Ferritin: 29 ng/mL (ref 24–336)

## 2022-12-23 LAB — PREPARE RBC (CROSSMATCH)

## 2022-12-23 LAB — IRON AND TIBC
Iron: 9 ug/dL — ABNORMAL LOW (ref 45–182)
Saturation Ratios: 3 % — ABNORMAL LOW (ref 17.9–39.5)
TIBC: 291 ug/dL (ref 250–450)
UIBC: 282 ug/dL

## 2022-12-23 LAB — VITAMIN B12: Vitamin B-12: 206 pg/mL (ref 180–914)

## 2022-12-23 MED ORDER — ENSURE ENLIVE PO LIQD
237.0000 mL | Freq: Three times a day (TID) | ORAL | Status: DC
  Administered 2022-12-23 – 2022-12-26 (×8): 237 mL via ORAL

## 2022-12-23 MED ORDER — PROSOURCE PLUS PO LIQD
30.0000 mL | Freq: Two times a day (BID) | ORAL | Status: DC
  Administered 2022-12-23 – 2022-12-26 (×4): 30 mL via ORAL

## 2022-12-23 MED ORDER — SODIUM CHLORIDE 0.9% IV SOLUTION: Freq: Once | INTRAVENOUS | Status: AC

## 2022-12-23 MED ORDER — FE FUM-VIT C-VIT B12-FA 460-60-0.01-1 MG PO CAPS
1.0000 | ORAL_CAPSULE | Freq: Two times a day (BID) | ORAL | Status: DC
  Administered 2022-12-24 – 2022-12-26 (×5): 1 via ORAL
  Filled 2022-12-23 (×7): qty 1

## 2022-12-23 NOTE — Plan of Care (Signed)
  Problem: Education: Goal: Knowledge of General Education information will improve Description: Including pain rating scale, medication(s)/side effects and non-pharmacologic comfort measures Outcome: Progressing   Problem: Pain Managment: Goal: General experience of comfort will improve Outcome: Progressing   Problem: Elimination: Goal: Will not experience complications related to bowel motility Outcome: Progressing   Problem: Nutrition: Goal: Adequate nutrition will be maintained Outcome: Progressing   Problem: Coping: Goal: Level of anxiety will decrease Outcome: Progressing

## 2022-12-23 NOTE — Progress Notes (Signed)
Physical Therapy Treatment Patient Details Name: Curtis West MRN: [id number] DOB: [birth year] Today's Date: 12/23/2022   History of Present Illness Patient is a 89 year old male with closed right hip fracture s/p IM nail following a fall at memory care unit. History of Alzheimer's dementia, HTN, arthritis, DVT.    PT Comments    Patient just finished getting a unit of blood. Patient continues to require significant physical assistance with mobility. + 2 person assistance required for bed mobility. Sitting balance is poor initially with left lateral lean to off load the right hip. Frequent re-direction and encouragement needed for participation and patient intermittently yelling out, asking for help and what to do next. The patient was unable to stand with maximal assistance of 2 person and elevated bed height. Activity limited by pain and cognition. Recommend to continue PT to maximize independence and decrease caregiver burden. SNF is recommended at discharge at this time.    Recommendations for follow up therapy are one component of a multi-disciplinary discharge planning process, led by the attending physician.  Recommendations may be updated based on patient status, additional functional criteria and insurance authorization.  Follow Up Recommendations  Skilled nursing-short term rehab (<3 hours/day) Can patient physically be transported by private vehicle: No   Assistance Recommended at Discharge Frequent or constant Supervision/Assistance  Patient can return home with the following Two people to help with walking and/or transfers;A lot of help with bathing/dressing/bathroom;Help with stairs or ramp for entrance;Assist for transportation   Equipment Recommendations   (to be determined at next level of care)    Recommendations for Other Services       Precautions / Restrictions Precautions Precautions: Fall Restrictions Weight Bearing Restrictions: Yes RLE Weight Bearing:  Weight bearing as tolerated     Mobility  Bed Mobility Overal bed mobility: Needs Assistance Bed Mobility: Supine to Sit, Sit to Supine     Supine to sit: Max assist, +2 for physical assistance, HOB elevated Sit to supine: Max assist, +2 for physical assistance   General bed mobility comments: +2 person assistance required. verbal cues for sequencing and task initiation. increased time and effort requried with pain reported with any movement of RLE.    Transfers Overall transfer level: Needs assistance                 General transfer comment: attempted standing x 2 bouts from bed with elevated bed height with Max A +2 person. the patient does accept some weight in BLE with faciliation for anterior weight shifting but is unable to stand. limited by pain and cognition with frequent redirection required for participation    Ambulation/Gait                   Stairs             Wheelchair Mobility    Modified Rankin (Stroke Patients Only)       Balance Overall balance assessment: Needs assistance, History of Falls, Independent Sitting-balance support: Bilateral upper extremity supported Sitting balance-Leahy Scale: Poor Sitting balance - Comments: left lean preferred for off loading right hip in sitting. maximal assistance required to achieve midline sitting balance initially. progressed to fair sitting balance with increased time                                    Cognition Arousal/Alertness: Awake/alert Behavior During Therapy: Flat affect Overall Cognitive Status: History of  cognitive impairments - at baseline                                 General Comments: patient confused throughout session. he has difficulty following single step commands. maximal encouragement required        Exercises      General Comments General comments (skin integrity, edema, etc.): patient set-up with lunch tray in bed at end of session  with nurse present      Pertinent Vitals/Pain Pain Assessment Pain Assessment: Faces Faces Pain Scale: Hurts even more Pain Location: R hip Pain Descriptors / Indicators: Grimacing, Moaning Pain Intervention(s): Limited activity within patient's tolerance, Monitored during session, Repositioned, RN gave pain meds during session    Home Living                          Prior Function            PT Goals (current goals can now be found in the care plan section) Acute Rehab PT Goals Patient Stated Goal: to get up PT Goal Formulation: With patient Time For Goal Achievement: 01/05/23 Potential to Achieve Goals: Poor Progress towards PT goals: Progressing toward goals    Frequency    7X/week      PT Plan Current plan remains appropriate    Co-evaluation              AM-PAC PT "6 Clicks" Mobility   Outcome Measure  Help needed turning from your back to your side while in a flat bed without using bedrails?: A Lot Help needed moving from lying on your back to sitting on the side of a flat bed without using bedrails?: A Lot Help needed moving to and from a bed to a chair (including a wheelchair)?: Total Help needed standing up from a chair using your arms (e.g., wheelchair or bedside chair)?: Total Help needed to walk in hospital room?: Total Help needed climbing 3-5 steps with a railing? : Total 6 Click Score: 8    End of Session   Activity Tolerance: Patient limited by pain (limited by cognition) Patient left: in bed;with call bell/phone within reach;with bed alarm set;with SCD's reapplied Nurse Communication: Mobility status PT Visit Diagnosis: Difficulty in walking, not elsewhere classified (R26.2);Pain Pain - Right/Left: Right Pain - part of body: Hip     Time: 1451-1515 PT Time Calculation (min) (ACUTE ONLY): 24 min  Charges:  $Therapeutic Activity: 23-37 mins                     Minna Merritts, PT, MPT    Curtis West 12/23/2022,  3:38 PM

## 2022-12-23 NOTE — TOC Progression Note (Signed)
Transition of Care [facility]) - Progression Note    Patient Details  Name: Curtis West MRN: [id number] Date of Birth: 10-15-1932  [facility]) CM/SW [place], RN Phone Number: 12/23/2022, 12:20 PM  Clinical Narrative:     Spoke with [facility] memory care and they will not be able to provide the level of care he needs with being a 2+ assist to stand, he will need to go to STR prior to returning.  Called the son Shanon Brow and explained the situation and that Nanine Means will not be able to care for the patient until he' \\goes'$  to rehab and that the wife does not have the need to go to Rehab, he stated understanding and said that they will do the same as they are currently doing and pick up the wife and take her to visit him daily, he is agreeable to a bedsearch to find a rehab,       Expected Discharge Plan and Services                                               Social Determinants of Health (SDOH) Interventions SDOH Screenings   Tobacco Use: Medium Risk (12/21/2022)    Readmission Risk Interventions     No data to display

## 2022-12-23 NOTE — Progress Notes (Signed)
Nutrition Follow-up  DOCUMENTATION CODES:   Non-severe (moderate) malnutrition in context of chronic illness  INTERVENTION:   -Continue with liberalized diet of regular for widest variety of meal selections -Increase Ensure Enlive po to TID, each supplement provides 350 kcal and 20 grams of protein.  -30 ml Proosurce Plus BID, each supplement provides 100 kcals and 15 grams protein -Feeding assistance with meals  NUTRITION DIAGNOSIS:   Moderate Malnutrition related to chronic illness (dementia) as evidenced by mild fat depletion, moderate fat depletion, mild muscle depletion, moderate muscle depletion.  Ongoing  GOAL:   Patient will meet greater than or equal to 90% of their needs  Progressing   MONITOR:   PO intake, Supplement acceptance  REASON FOR ASSESSMENT:   Consult Hip fracture protocol  ASSESSMENT:   Pt admitted from SNF d/t R hip fracture following a fall. PMH significant for HTN, HLD, arthritis, DVT, Alzheimer's dementia.  1/13- s/p PROCEDURE: Right INTRAMEDULLARY (IM) NAIL INTERTROCHANTERIC   Reviewed I/O's: +383 ml x 24 hours and +2.2 L since admission  UOP: 450 ml x 24 hours   Pt sitting up in bed at time of visit. Pt reports feeling better and has a good appetite. However, pt only consumed about 50% of sausage and 25% of pancakes. Pt consumed most of the liquids on his tray. Pt denies any history chewing or swallowing. Due to dementia, pt unable to provide accurate history.   Reviewed wt hx; pt has experienced a 5% wt loss over the past 11 months, which is not significant for time frame.   Discussed importance of good meal and supplement intake to promote healing. Pt would benefit from addition of oral nutrition supplements given malnutrition and increased nutritional needs to support post-operative healing.   Per TOC notes, likely plan to discharge back to [facility] once medically stable.   Labs reviewed: Na: 132.    NUTRITION - FOCUSED  PHYSICAL EXAM:  Flowsheet Row Most Recent Value  Orbital Region Moderate depletion  Upper Arm Region Moderate depletion  Thoracic and Lumbar Region Mild depletion  Buccal Region Moderate depletion  Temple Region Severe depletion  Clavicle Bone Region Moderate depletion  Clavicle and Acromion Bone Region Moderate depletion  Scapular Bone Region Moderate depletion  Dorsal Hand Mild depletion  Patellar Region Mild depletion  Anterior Thigh Region Mild depletion  Posterior Calf Region Mild depletion  Edema (RD Assessment) None  Hair Reviewed  Eyes Reviewed  Mouth Reviewed  Skin Reviewed  Nails Reviewed       Diet Order:   Diet Order             Diet regular Room service appropriate? Yes; Fluid consistency: Thin  Diet effective now                   EDUCATION NEEDS:   Not appropriate for education at this time  Skin:  Skin Assessment: Reviewed RN Assessment ((closed) R hip incision)  Last BM:  Unknown  Height:   Ht Readings from Last 1 Encounters:  12/20/22 6' (1.829 m)    Weight:   Wt Readings from Last 1 Encounters:  12/20/22 86.2 kg   BMI:  Body mass index is 25.77 kg/m.  Estimated Nutritional Needs:   Kcal:  2100-2300  Protein:  105-120g  Fluid:  >/=2L    Loistine Chance, RD, LDN, [place] Registered Dietitian II Certified Diabetes Care and Education Specialist Please refer to [facility] for RD and/or RD on-call/weekend/after hours pager

## 2022-12-23 NOTE — Progress Notes (Signed)
Progress Note   Patient: Curtis West CHE:[id number] DOB: 1931/12/15 DOA: 12/20/2022     3 DOS: the patient was seen and examined on 12/23/2022   Brief hospital course: Taken from H&P.  Curtis West is a 89 y.o. male with medical history significant of hypertension, hyperlipidemia, arthritis, DVT on Eliquis, and Alzheimer's dementia, who presents with unwitnessed fall at memory care unit and right hip pain.  No loss of consciousness.   ED course and data reviewed.  pt was found to have WBC 6.4, GFR> 60, temperature normal, blood pressure 98/48, heart rate 71, RR 16, oxygen saturation 100% on room air.  Chest x-ray negative.  CT of the head negative.  CT of C-spine negative for acute injury.  X-ray of right hip/pelvis showed comminuted, mildly displaced intertrochanteric fracture of the right femur.  He was also having difficulty urinating in ED so Foley catheter was placed.  Orthopedic was consulted and patient will be going to the OR on 1/13.  1/13: Vitals with borderline blood pressure at 95/48, s/p ORIF earlier today.  Patient had significant blood loss during surgery and hemoglobin dropped to 6.8.  1 unit of PRBC ordered.  1/14: Vitals within normal limit.  Hemoglobin at 7.6 after improving to 8.8 s/p 1 unit of PRBC. Leukocytosis has been resolved.  Will hold Eliquis for another day, most likely will resume from tomorrow if hemoglobin remained stable.  Also starting him on iron supplement No prior history of urinary retention so Foley catheter will be removed today to give him a voiding trial.  PT is recommending SNF.  Son was very concerned that he will not do good if was sent somewhere new without his wife, he and his wife apparently living together in a memory care unit. Asked TOC to find out if his current memory care unit can provide PT and OT so he can remain in the prior setting.  1/15: Vitals stable.  Hemoglobin with further decreased to 6.7, sodium with persistent mild  hyponatremia at 132.  Orthopedic surgery ordered 1 more unit of PRBC. We will keep holding Eliquis until hemoglobin stabilizes. Anemia panel with iron deficiency, borderline folate and B12 pending.  Increasing the frequency of supplement to twice daily.   Assessment and Plan: * Closed right hip fracture (HCC) S/p ORIF.  Patient had significant blood loss during surgery. Clinically seems improving.  PT is recommending SNF -Continue with pain management   Fall at home, initial encounter -PT/OT evaluation  Normocytic anemia Acute blood loss anemia with surgery. Hemoglobin again dropped to 6.7, no obvious bleeding.  Ordered another unit of PRBC.  Anemia panel also with iron deficiency with borderline folic acid and N36 pending. -1 unit of PRBC -Increasing the dose of supplement to twice daily -Monitor hemoglobin -Transfuse if below 7 -Hold restarting Eliquis until hemoglobin stabilizes  HTN (hypertension) Patient is not on any medications at home.  Blood pressure within goal -Continue to monitor  DVT, lower extremity ([place]) Patient was on Eliquis at home which is currently being held, initially for surgery. -Keep holding Eliquis for another day, can resume tomorrow if hemoglobin remained stable.  Asthma without status asthmaticus -No acute concern -As needed bronchodilators  Depression with anxiety -Continue home meds   Subjective: Patient was seen and examined today.  Orientation at baseline, eating breakfast with the help of nursing tech.  Denies any pain.  Physical Exam: Vitals:   12/23/22 0018 12/23/22 0807 12/23/22 1133 12/23/22 1207  BP: (!) 123/54 (!) 100/47 (!) 113/55 Marland Kitchen)  92/53  Pulse: 89 88 92 76  Resp: '18 17 16 18  '$ Temp: 98.2 F (36.8 C) 100.3 F (37.9 C) 98.3 F (36.8 C) 98.7 F (37.1 C)  TempSrc:   Oral   SpO2: 90% 97% 92% 93%  Weight:      Height:       General.  Frail gentleman, in no acute distress. Pulmonary.  Lungs clear bilaterally, normal  respiratory effort. CV.  Regular rate and rhythm, no JVD, rub or murmur. Abdomen.  Soft, nontender, nondistended, BS positive. CNS.  Alert and oriented to self only.  No focal neurologic deficit. Extremities.  No edema, no cyanosis, pulses intact and symmetrical. Psychiatry.  Judgment and insight appears impaired.  Data Reviewed: Prior data reviewed  Family Communication: Discussed with DIL on phone  Disposition: Status is: Inpatient Remains inpatient appropriate because: Severity of illness  Planned Discharge Destination: Skilled nursing facility  DVT prophylaxis.  Heparin Time spent: 41 minutes  This record has been created using Systems analyst. Errors have been sought and corrected,but may not always be located. Such creation errors do not reflect on the standard of care.   Author: Lorella Nimrod, MD 12/23/2022 1:23 PM  For on call review [email or web address].

## 2022-12-23 NOTE — Progress Notes (Signed)
Subjective:  Patient appears to be resting comfortably in bed.  He has dementia and is unable to provide history.  Patient was given 1 unit PRBC for hematocrit 22 on POD #0.  Hematocrit is 20.8  this morning.   Objective:   VITALS:   Vitals:   12/22/22 0458 12/22/22 0954 12/22/22 1736 12/23/22 0018  BP: 121/60 (!) 109/57 123/60 (!) 123/54  Pulse: 77 82 91 89  Resp: '16 17 16 18  '$ Temp: 98.6 F (37 C) 98.6 F (37 C) 98.9 F (37.2 C) 98.2 F (36.8 C)  TempSrc:      SpO2: 90% 97% 95% 90%  Weight:      Height:        PHYSICAL EXAM:  General: In no acute distress Lower extremity: Dressings are clean, dry, intact, compartments are soft, grossly motor and sensory intact  LABS  Results for orders placed or performed during the hospital encounter of 12/20/22 (from the past 24 hour(s))  CBC     Status: Abnormal   Collection Time: 12/23/22  5:56 AM  Result Value Ref Range   WBC 7.5 4.0 - 10.5 K/uL   RBC 2.57 (L) 4.22 - 5.81 MIL/uL   Hemoglobin 6.7 (L) 13.0 - 17.0 g/dL   HCT 20.8 (L) 39.0 - 52.0 %   MCV 80.9 80.0 - 100.0 fL   MCH 26.1 26.0 - 34.0 pg   MCHC 32.2 30.0 - 36.0 g/dL   RDW 14.8 11.5 - 15.5 %   Platelets 156 150 - 400 K/uL   nRBC 0.0 0.0 - 0.2 %  Basic metabolic panel     Status: Abnormal   Collection Time: 12/23/22  5:56 AM  Result Value Ref Range   Sodium 132 (L) 135 - 145 mmol/L   Potassium 4.1 3.5 - 5.1 mmol/L   Chloride 105 98 - 111 mmol/L   CO2 23 22 - 32 mmol/L   Glucose, Bld 117 (H) 70 - 99 mg/dL   BUN 20 8 - 23 mg/dL   Creatinine, Ser 1.10 0.61 - 1.24 mg/dL   Calcium 7.7 (L) 8.9 - 10.3 mg/dL   GFR, Estimated >60 >60 mL/min   Anion gap 4 (L) 5 - 15  Prepare RBC (crossmatch)     Status: None   Collection Time: 12/23/22  6:30 AM  Result Value Ref Range   Order Confirmation      ORDER PROCESSED BY BLOOD BANK Performed at [facility], [address] [place], [place] [address]     DG FEMUR, [place] 2 VIEWS RIGHT  Result Date:  12/21/2022 CLINICAL DATA:  Status post internal fixation of right hip fracture. EXAM: RIGHT FEMUR 2 VIEWS COMPARISON:  None Available. FINDINGS: Right hip screws and femoral intramedullary rod with distal fixation screws are seen transfixing an intertrochanteric right hip fracture in near anatomic alignment. No evidence of dislocation. Knee prosthesis also noted. IMPRESSION: Internal fixation of intertrochanteric right hip fracture in near anatomic alignment. Electronically Signed   By: Marlaine Hind M.D.   On: 12/21/2022 10:20   DG C-Arm 1-60 Min-No Report  Result Date: 12/21/2022 Fluoroscopy was utilized by the requesting physician.  No radiographic interpretation.   DG C-Arm 1-60 Min-No Report  Result Date: 12/21/2022 Fluoroscopy was utilized by the requesting physician.  No radiographic interpretation.    Assessment/Plan: 2 Days Post-Op  Status post right hip IM nail  Principal Problem:   Closed right hip fracture (HCC) Active Problems:   Asthma without status asthmaticus   Normocytic  anemia   DVT, lower extremity ([place])   Fall at home, initial encounter   HTN (hypertension)   Depression with anxiety   -Appreciate hospitalist team management -Recommend 2u PRBC transfusion -PT/OT: Weight-bear as tolerated right lower extremity -DVT prophylaxis: Continue continue to hold Eliquis until resolution of acute postoperative anemia -Dressing change per nursing to keep clean and dry -Discharge pending PT and case management   Renee Harder , MD 12/23/2022, 7:50 AM

## 2022-12-23 NOTE — NC FL2 (Signed)
[place] LEVEL OF CARE FORM     IDENTIFICATION  Patient Name: Curtis West Birthdate: July 09, 1932 Sex: male Admission Date (Current Location): 12/20/2022  [facility] and [place] Number:  Engineering geologist and Address:  [facility], [address], [place], [place] [address]      Provider Number: [id number]  Attending Physician Name and Address:  Lorella Nimrod, MD  Relative Name and Phone Number:  Shanon Brow, son [phone]    Current Level of Care: Hospital Recommended Level of Care: [place] Prior Approval Number:    Date Approved/Denied:   PASRR Number: [id number]  Discharge Plan: SNF    Current Diagnoses: Patient Active Problem List   Diagnosis Date Noted   Closed right hip fracture ([place]) 12/20/2022   Fall at home, initial encounter 12/20/2022   HTN (hypertension) 12/20/2022   Dementia ([place]im[place]) 12/20/2022   Depression with anxiety 12/20/2022   DVT, lower extremity ([place]) 06/02/2022   Arthritis 12/23/2019   Asthma without status asthmaticus 12/23/2019   Seasonal allergies 12/23/2019   Calculus of gallbladder without cholecystitis without obstruction 12/23/2019   OSA (obstructive sleep apnea) 02/23/2018   Esophageal dysphagia 02/03/2018   Normocytic anemia 02/03/2018   Posterior capsular opacification non visually significant of left eye 01/30/2018   Fissure in skin of foot 08/27/2016   Hammer toes of both feet 08/27/2016   Onychomycosis due to dermatophyte 08/27/2016   H/O total knee replacement, right 04/27/2015   Primary osteoarthritis of right hip 04/26/2015   Personal history of other malignant neoplasm of skin 08/12/2014   Squamous cell carcinoma 04/05/2014   Arthritis of knee 04/29/2012   S/P knee replacement 04/29/2012   Allergic rhinitis due to pollen 06/18/2011   Dyspepsia and other specified disorders of function of stomach 06/18/2011   Mixed hyperlipidemia 06/18/2011   Osteoarthritis of  multiple joints 06/18/2011   Rosacea 06/18/2011   Venous (peripheral) insufficiency 06/18/2011   Pseudophakia of right eye 10/30/2009   Pseudophakia of left eye 09/18/2009    Orientation RESPIRATION BLADDER Height & Weight     Self  Normal Continent, External catheter Weight: 86.2 kg Height:  6' (182.9 cm)  BEHAVIORAL SYMPTOMS/MOOD NEUROLOGICAL BOWEL NUTRITION STATUS      Incontinent Diet (See DC summary)  AMBULATORY STATUS COMMUNICATION OF NEEDS Skin   Extensive Assist Verbally Normal, Surgical wounds                       Personal Care Assistance Level of Assistance  Bathing, Feeding, Dressing Bathing Assistance: Maximum assistance Feeding assistance: Limited assistance Dressing Assistance: Maximum assistance     Functional Limitations Info  Sight, Hearing, Speech Sight Info: Impaired Hearing Info: Impaired Speech Info: Adequate    SPECIAL CARE FACTORS FREQUENCY  PT (By licensed PT), OT (By licensed OT)     PT Frequency: 5 times per week OT Frequency: 5 times per week            Contractures Contractures Info: Not present    Additional Factors Info  Code Status, Allergies Code Status Info: DNR Allergies Info: Amoxicillin, Sulfa ANtibiotics           Current Medications (12/23/2022):  This is the current hospital active medication list Current Facility-Administered Medications  Medication Dose Route Frequency Provider Last Rate Last Admin   (feeding supplement) PROSource Plus liquid 30 mL  30 mL Oral BID BM Lorella Nimrod, MD       acetaminophen (TYLENOL) tablet 325-650 mg  325-650  mg Oral Q6H PRN Renee Harder, MD   325 mg at 12/22/22 1031   Chlorhexidine Gluconate Cloth 2 % PADS 6 each  6 each Topical Daily Lorella Nimrod, MD   6 each at 12/23/22 0959   docusate sodium (COLACE) capsule 100 mg  100 mg Oral BID Renee Harder, MD   100 mg at 12/23/22 0959   Fe Fum-Vit C-Vit B12-FA (TRIGELS-F FORTE) capsule 1 capsule  1 capsule Oral QPC breakfast  Lorella Nimrod, MD   1 capsule at 12/23/22 0958   feeding supplement (ENSURE ENLIVE / ENSURE PLUS) liquid 237 mL  237 mL Oral TID BM Lorella Nimrod, MD       fluticasone (FLONASE) 50 MCG/ACT nasal spray 1 spray  1 spray Each Nare BID Renee Harder, MD   1 spray at 12/23/22 8101   hydrALAZINE (APRESOLINE) injection 5 mg  5 mg Intravenous Q2H PRN Renee Harder, MD       HYDROcodone-acetaminophen (NORCO) 7.5-325 MG per tablet 1-2 tablet  1-2 tablet Oral Q4H PRN Renee Harder, MD       HYDROcodone-acetaminophen (NORCO/VICODIN) 5-325 MG per tablet 1-2 tablet  1-2 tablet Oral Q4H PRN Renee Harder, MD   1 tablet at 12/23/22 0958   lidocaine (LIDODERM) 5 % 1 patch  1 patch Transdermal Q24H Renee Harder, MD   1 patch at 12/23/22 7510   LORazepam (ATIVAN) tablet 0.5 mg  0.5 mg Oral BID PRN Renee Harder, MD       melatonin tablet 5 mg  5 mg Oral Dewain Penning, MD   5 mg at 12/22/22 2206   menthol-cetylpyridinium (CEPACOL) lozenge 3 mg  1 lozenge Oral PRN Renee Harder, MD       Or   phenol (CHLORASEPTIC) mouth spray 1 spray  1 spray Mouth/Throat PRN Renee Harder, MD       methocarbamol (ROBAXIN) tablet 500 mg  500 mg Oral Q8H PRN Renee Harder, MD       metoCLOPramide (REGLAN) tablet 5-10 mg  5-10 mg Oral Q8H PRN Renee Harder, MD       Or   metoCLOPramide (REGLAN) injection 5-10 mg  5-10 mg Intravenous Q8H PRN Renee Harder, MD       morphine (PF) 2 MG/ML injection 0.5-1 mg  0.5-1 mg Intravenous Q2H PRN Renee Harder, MD       multivitamin with minerals tablet 1 tablet  1 tablet Oral Daily Lorella Nimrod, MD   1 tablet at 12/23/22 0957   ondansetron (ZOFRAN) tablet 4 mg  4 mg Oral Q6H PRN Renee Harder, MD       Or   ondansetron [facility]) injection 4 mg  4 mg Intravenous Q6H PRN Renee Harder, MD       QUEtiapine (SEROQUEL) tablet 50 mg  50 mg Oral Dewain Penning, MD   50 mg at 12/22/22 2206   senna-docusate (Senokot-S) tablet 1  tablet  1 tablet Oral QHS PRN Renee Harder, MD   1 tablet at 12/21/22 2133   sertraline (ZOLOFT) tablet 25 mg  25 mg Oral Daily Renee Harder, MD   25 mg at 12/23/22 2585     Discharge Medications: Please see discharge summary for a list of discharge medications.  Relevant Imaging Results:  Relevant Lab Results:   Additional Information SS# [id number]  Conception Oms, RN

## 2022-12-23 NOTE — Progress Notes (Signed)
Hemoglobin 6.7 this morning. Dr. Sidney Ace aware. See new orders.

## 2022-12-23 NOTE — Plan of Care (Addendum)
No new changes in assessment. Adequate urine output. Last BM unknown.  Problem: Education: Goal: Knowledge of General Education information will improve Description: Including pain rating scale, medication(s)/side effects and non-pharmacologic comfort measures Outcome: Progressing   Problem: Health Behavior/Discharge Planning: Goal: Ability to manage health-related needs will improve Outcome: Progressing   Problem: Clinical Measurements: Goal: Ability to maintain clinical measurements within normal limits will improve Outcome: Progressing Goal: Will remain free from infection Outcome: Progressing Goal: Diagnostic test results will improve Outcome: Progressing Goal: Respiratory complications will improve Outcome: Progressing Goal: Cardiovascular complication will be avoided Outcome: Progressing   Problem: Activity: Goal: Risk for activity intolerance will decrease Outcome: Progressing   Problem: Nutrition: Goal: Adequate nutrition will be maintained Outcome: Progressing   Problem: Coping: Goal: Level of anxiety will decrease Outcome: Progressing   Problem: Elimination: Goal: Will not experience complications related to bowel motility Outcome: Progressing Goal: Will not experience complications related to urinary retention Outcome: Progressing   Problem: Pain Managment: Goal: General experience of comfort will improve Outcome: Progressing   Problem: Safety: Goal: Ability to remain free from injury will improve Outcome: Progressing   Problem: Skin Integrity: Goal: Risk for impaired skin integrity will decrease Outcome: Progressing   Problem: Education: Goal: Verbalization of understanding the information provided (i.e., activity precautions, restrictions, etc) will improve Outcome: Progressing Goal: Individualized Educational Video(s) Outcome: Progressing   Problem: Activity: Goal: Ability to ambulate and perform ADLs will improve Outcome: Progressing    Problem: Clinical Measurements: Goal: Postoperative complications will be avoided or minimized Outcome: Progressing   Problem: Self-Concept: Goal: Ability to maintain and perform role responsibilities to the fullest extent possible will improve Outcome: Progressing   Problem: Pain Management: Goal: Pain level will decrease Outcome: Progressing

## 2022-12-24 ENCOUNTER — Inpatient Hospital Stay (HOSPITAL_COMMUNITY)
Admit: 2022-12-24 | Discharge: 2022-12-24 | Disposition: A | Discharge status: Home / Self Care | Payer: Medicare Other | Attending: Internal Medicine | Admitting: Internal Medicine

## 2022-12-24 DIAGNOSIS — I4891 Unspecified atrial fibrillation: Secondary | ICD-10-CM

## 2022-12-24 DIAGNOSIS — S72001A Fracture of unspecified part of neck of right femur, initial encounter for closed fracture: Secondary | ICD-10-CM | POA: Diagnosis not present

## 2022-12-24 LAB — CBC
HCT: 22.3 % — ABNORMAL LOW (ref 39.0–52.0)
Hemoglobin: 7.2 g/dL — ABNORMAL LOW (ref 13.0–17.0)
MCH: 26.7 pg (ref 26.0–34.0)
MCHC: 32.3 g/dL (ref 30.0–36.0)
MCV: 82.6 fL (ref 80.0–100.0)
Platelets: 163 10*3/uL (ref 150–400)
RBC: 2.7 MIL/uL — ABNORMAL LOW (ref 4.22–5.81)
RDW: 15.6 % — ABNORMAL HIGH (ref 11.5–15.5)
WBC: 7.5 10*3/uL (ref 4.0–10.5)
nRBC: 0 % (ref 0.0–0.2)

## 2022-12-24 LAB — BPAM RBC
Blood Product Expiration Date: 202402192359
Blood Product Expiration Date: 202402192359
ISSUE DATE / TIME: 202401131225
ISSUE DATE / TIME: 202401151141
Unit Type and Rh: 5100
Unit Type and Rh: 5100

## 2022-12-24 LAB — TYPE AND SCREEN
ABO/RH(D): O NEG
Antibody Screen: NEGATIVE
Unit division: 0
Unit division: 0

## 2022-12-24 LAB — HEMOGLOBIN AND HEMATOCRIT, BLOOD
HCT: 30.1 % — ABNORMAL LOW (ref 39.0–52.0)
Hemoglobin: 9.7 g/dL — ABNORMAL LOW (ref 13.0–17.0)

## 2022-12-24 LAB — PREPARE RBC (CROSSMATCH)

## 2022-12-24 MED ORDER — SODIUM CHLORIDE 0.9 % IV SOLN
250.0000 mg | Freq: Every day | INTRAVENOUS | Status: AC
  Administered 2022-12-24 – 2022-12-25 (×2): 250 mg via INTRAVENOUS
  Filled 2022-12-24: qty 10
  Filled 2022-12-24: qty 20

## 2022-12-24 MED ORDER — METOPROLOL TARTRATE 25 MG PO TABS
12.5000 mg | ORAL_TABLET | Freq: Four times a day (QID) | ORAL | Status: DC
  Administered 2022-12-24 – 2022-12-25 (×3): 12.5 mg via ORAL
  Filled 2022-12-24 (×4): qty 1

## 2022-12-24 MED ORDER — SODIUM CHLORIDE 0.9% IV SOLUTION: Freq: Once | INTRAVENOUS | Status: AC

## 2022-12-24 MED ORDER — METOPROLOL TARTRATE 5 MG/5ML IV SOLN
5.0000 mg | Freq: Once | INTRAVENOUS | Status: AC
  Administered 2022-12-24: 5 mg via INTRAVENOUS
  Filled 2022-12-24: qty 5

## 2022-12-24 MED ORDER — APIXABAN 5 MG PO TABS
5.0000 mg | ORAL_TABLET | Freq: Two times a day (BID) | ORAL | Status: DC
  Administered 2022-12-24 – 2022-12-26 (×4): 5 mg via ORAL
  Filled 2022-12-24 (×4): qty 1

## 2022-12-24 NOTE — Progress Notes (Signed)
1620 In chart, performed EKG a nurse and MD at bedside

## 2022-12-24 NOTE — Progress Notes (Signed)
Physical Therapy Treatment Patient Details Name: Curtis West MRN: [id number] DOB: [birth year] Today's Date: 12/24/2022   History of Present Illness Patient is a 89 year old male with closed right hip fracture s/p IM nail following a fall at memory care unit. History of Alzheimer's dementia, HTN, arthritis, DVT.   PT Comments    The patient was seen with family at the bedside (son and spouse) and seems less anxious today. He continues to have pain in the right hip with movement. +2 person assistance required for bed mobility. The patient did progress to standing today x 3 bouts with rest breaks required between bouts of standing. Unable to achieve full upright standing position with trunk flexed while standing. Standing tolerance is around 10 seconds with each standing bout. Unable to take steps today. PT will continue to follow to maximize independence and decrease caregiver burden.    Recommendations for follow up therapy are one component of a multi-disciplinary discharge planning process, led by the attending physician.  Recommendations may be updated based on patient status, additional functional criteria and insurance authorization.  Follow Up Recommendations  Skilled nursing-short term rehab (<3 hours/day) Can patient physically be transported by private vehicle: No   Assistance Recommended at Discharge Frequent or constant Supervision/Assistance  Patient can return home with the following Two people to help with walking and/or transfers;A lot of help with bathing/dressing/bathroom;Help with stairs or ramp for entrance;Assist for transportation   Equipment Recommendations  Other (comment) (to be determined at next level of care)    Recommendations for Other Services       Precautions / Restrictions Precautions Precautions: Fall Restrictions Weight Bearing Restrictions: Yes RLE Weight Bearing: Weight bearing as tolerated     Mobility  Bed Mobility Overal bed mobility:  Needs Assistance Bed Mobility: Supine to Sit, Sit to Supine     Supine to sit: Max assist, +2 for physical assistance Sit to supine: Max assist, +2 for physical assistance   General bed mobility comments: assistance for trunk and BLE support. increased time and effort required with mobility efforts.    Transfers Overall transfer level: Needs assistance Equipment used: Rolling walker (2 wheels) Transfers: Sit to/from Stand, Bed to chair/wheelchair/BSC Sit to Stand: Max assist, +2 physical assistance, From elevated surface          Lateral/Scoot Transfers: Max assist, +2 physical assistance General transfer comment: significant assistance reuqired to stand. patient was able to come to partial standing position ( trunk flexed) and clear hips from the bed x 3 times with rest breaks between bouts of standing. incremental scooting transfer to the left while sitting in bed using the draw pad with cues for technique    Ambulation/Gait         Gait velocity: unable to at this time         Stairs             Wheelchair Mobility    Modified Rankin (Stroke Patients Only)       Balance Overall balance assessment: Needs assistance Sitting-balance support: Feet supported Sitting balance-Leahy Scale: Fair     Standing balance support: Bilateral upper extremity supported Standing balance-Leahy Scale: Zero Standing balance comment: +2 perrson assistance required to maintain standing balance with rolling walker for UE support. trunk flexed and cues for upright standing posture                            Cognition Arousal/Alertness: Awake/alert Behavior During  Therapy: Flat affect Overall Cognitive Status: History of cognitive impairments - at baseline                                 General Comments: family was at the bedside and the son was very helpful with encouragement. patient seems to be less anxious with the family in the room compared to  yesterday. patient able to follow single step commands with increased time and multi-modal cues        Exercises      General Comments General comments (skin integrity, edema, etc.): patient is less anxious with mobility efforts today      Pertinent Vitals/Pain Pain Assessment Pain Assessment: Faces Faces Pain Scale: Hurts even more Pain Location: R hip with movement Pain Descriptors / Indicators: Discomfort, Grimacing, Sore Pain Intervention(s): Limited activity within patient's tolerance, Monitored during session, Repositioned    Home Living                          Prior Function            PT Goals (current goals can now be found in the care plan section) Acute Rehab PT Goals Patient Stated Goal: to get up PT Goal Formulation: With patient Time For Goal Achievement: 01/05/23 Potential to Achieve Goals: Poor Progress towards PT goals: Progressing toward goals    Frequency    7X/week      PT Plan Current plan remains appropriate    Co-evaluation              AM-PAC PT "6 Clicks" Mobility   Outcome Measure  Help needed turning from your back to your side while in a flat bed without using bedrails?: A Lot Help needed moving from lying on your back to sitting on the side of a flat bed without using bedrails?: A Lot Help needed moving to and from a bed to a chair (including a wheelchair)?: Total Help needed standing up from a chair using your arms (e.g., wheelchair or bedside chair)?: Total Help needed to walk in hospital room?: Total Help needed climbing 3-5 steps with a railing? : Total 6 Click Score: 8    End of Session   Activity Tolerance: Patient tolerated treatment well;Patient limited by pain Patient left: in bed;with call bell/phone within reach;with bed alarm set;with SCD's reapplied;with family/visitor present Nurse Communication: Mobility status PT Visit Diagnosis: Difficulty in walking, not elsewhere classified  (R26.2);Pain Pain - Right/Left: Right Pain - part of body: Hip     Time: 2263-3354 PT Time Calculation (min) (ACUTE ONLY): 32 min  Charges:  $Therapeutic Activity: 23-37 mins                     Curtis West, PT, MPT    Curtis West 12/24/2022, 2:58 PM

## 2022-12-24 NOTE — Plan of Care (Signed)

## 2022-12-24 NOTE — Progress Notes (Signed)
5 mg metoprolol IV given for heart rate of 145 as ordered

## 2022-12-24 NOTE — Consult Note (Signed)
Cardiology Consultation   Patient ID: Marqus Macphee MRN: [id number]; DOB: 1932/08/23  Admit date: 12/20/2022 Date of Consult: 12/24/2022  PCP:  [place] [facility], [facility] Cardiologist:  New - Shahid Flori  Patient Profile:   [place] Fassnacht is a 89 y.o. male with a hx of hypertension, hyperlipidemia, DVT on chronic anticoagulation with apixaban, arthritis, and Alzheimer's dementia, who is being seen 12/24/2022 for the evaluation of postoperative atrial fibrillation at the request of Amin.  History of Present Illness:   History is obtained from the chart due to Mr. Dettinger's advanced dementia.  He was admitted to [facility] on 12/20/2022 after having an unwitnessed fall at his memory care facility.  He had complained of right hip pain with external rotation of the right leg.  He did not complain of other symptoms at that time.  He was found to have a comminuted, minimally displaced intertrochanteric fracture of the right femur and underwent internal fixation with intramedullary nail placement by Dr. Sharlet Salina on 12/21/2022.  Hospital course has been complicated by acute blood loss anemia requiring PRBC transfusions and discontinuation of anticoagulation.  Today during routine vital checks, Mr. Manager was noted to be tachycardic.  Subsequent EKG showed atrial fibrillation with rapid ventricular response.  He was given metoprolol 5 mg IV x 1 with some improvement in rate control but subsequent hypotension.  He is without complaints at this time, denying chest pain, shortness of breath, palpitations, lightheadedness, and leg pain.   Past Medical History:  Diagnosis Date   Allergy    Seasonal Allergies   Alzheimer disease ([place])    Arthritis    Asthma    GERD (gastroesophageal reflux disease)    Hyperlipidemia    Hypertension    Rosacea    Skin cancer     Past Surgical History:  Procedure Laterality Date   INTRAMEDULLARY (IM) NAIL INTERTROCHANTERIC Right 12/21/2022    Procedure: INTRAMEDULLARY (IM) NAIL INTERTROCHANTERIC;  Surgeon: Renee Harder, MD;  Location: [facility] ORS;  Service: Orthopedics;  Laterality: Right;   REPLACEMENT TOTAL KNEE BILATERAL Bilateral    SINUS LIFT WITH BONE GRAFT     TONSILLECTOMY       Inpatient Medications: Scheduled Meds:  (feeding supplement) PROSource Plus  30 mL Oral BID BM   apixaban  5 mg Oral BID   docusate sodium  100 mg Oral BID   Fe Fum-Vit C-Vit B12-FA  1 capsule Oral BID WC   feeding supplement  237 mL Oral TID BM   fluticasone  1 spray Each Nare BID   lidocaine  1 patch Transdermal Q24H   melatonin  5 mg Oral QHS   metoprolol tartrate  12.5 mg Oral Q6H   multivitamin with minerals  1 tablet Oral Daily   QUEtiapine  50 mg Oral QHS   sertraline  25 mg Oral Daily   Continuous Infusions:  ferric gluconate (FERRLECIT) IVPB 250 mg (12/24/22 1647)   PRN Meds: acetaminophen, hydrALAZINE, HYDROcodone-acetaminophen, HYDROcodone-acetaminophen, LORazepam, menthol-cetylpyridinium **OR** phenol, methocarbamol, metoCLOPramide **OR** metoCLOPramide (REGLAN) injection, morphine injection, ondansetron **OR** ondansetron (ZOFRAN) IV, senna-docusate  Allergies:    Allergies  Allergen Reactions   Amoxicillin    Sulfa Antibiotics     "I get loopy"    Social History:   Social History   Tobacco Use   Smoking status: Former   Smokeless tobacco: Never  Substance Use Topics   Alcohol use: Not Currently   Drug use: Not Currently      Family History:  Family History  Problem Relation Age of Onset   ALS Father 10     ROS:  Unable to perform due to dementia.  Physical Exam/Data:   Vitals:   12/24/22 1328 12/24/22 1329 12/24/22 1530 12/24/22 1653  BP: 109/82 109/82 102/71 (!) 95/57  Pulse: (!) 121 (!) 121 (!) 146 (!) 124  Resp: 17  17   Temp: 98.5 F (36.9 C) 98.5 F (36.9 C) 98.7 F (37.1 C) 97.9 F (36.6 C)  TempSrc: Axillary     SpO2: 100% 100% 93% 94%  Weight:      Height:         Intake/Output Summary (Last 24 hours) at 12/24/2022 1716 Last data filed at 12/24/2022 1328 Gross per 24 hour  Intake 370 ml  Output 400 ml  Net -30 ml      12/20/2022    8:04 AM 01/29/2021    6:26 AM 09/30/2020    5:52 AM  Last 3 Weights  Weight (lbs) 190 lb 200 lb 205 lb  Weight (kg) 86.183 kg 90.719 kg 92.987 kg     Body mass index is 25.77 kg/m.  General:  Well nourished, well developed, in no acute distress. HEENT: normal Neck: no JVD Vascular: No carotid bruits; Distal pulses 2+ bilaterally Cardiac: Tachycardic and irregularly irregular rhythm without murmurs. Lungs:  clear to auscultation bilaterally, no wheezing, rhonchi or rales  Abd: soft, nontender, no hepatomegaly  Ext: Trace pretibial edema bilaterally Musculoskeletal:  No deformities, BUE and BLE strength normal and equal Skin: warm and dry  Neuro: No focal deaf cysts noted, though patient is ANO x 1. Psych: Mildly agitated, pulling at mittens.  EKG:  The EKG was personally reviewed and demonstrates: Atrial fibrillation with rapid ventricular response and possible inferior infarct. Telemetry:  Telemetry was personally reviewed and demonstrates: Atrial fibrillation with ventricular rates 100-150 bpm.  Relevant CV Studies: None.  Laboratory Data:  High Sensitivity Troponin:  No results for input(s): "TROPONINIHS" in the last 720 hours.   Chemistry Recent Labs  Lab 12/21/22 [phone] 12/22/22 0545 12/23/22 0556  NA 134* 133* 132*  K 4.1 3.9 4.1  CL 105 105 105  CO2 '23 24 23  '$ GLUCOSE 132* 116* 117*  BUN '17 20 20  '$ CREATININE 1.05 1.08 1.10  CALCIUM 7.9* 7.8* 7.7*  GFRNONAA >60 >60 >60  ANIONGAP 6 4* 4*    No results for input(s): "PROT", "ALBUMIN", "AST", "ALT", "ALKPHOS", "BILITOT" in the last 168 hours. Lipids No results for input(s): "CHOL", "TRIG", "HDL", "LABVLDL", "LDLCALC", "CHOLHDL" in the last 168 hours.  Hematology Recent Labs  Lab 12/22/22 0545 12/23/22 0556 12/23/22 1533 12/24/22 0229  12/24/22 1447  WBC 8.8 7.5  --  7.5  --   RBC 2.95* 2.57*  2.59*  --  2.70*  --   HGB 7.6* 6.7* 8.2* 7.2* 9.7*  HCT 24.1* 20.8* 25.3* 22.3* 30.1*  MCV 81.7 80.9  --  82.6  --   MCH 25.8* 26.1  --  26.7  --   MCHC 31.5 32.2  --  32.3  --   RDW 14.5 14.8  --  15.6*  --   PLT 146* 156  --  163  --    Thyroid No results for input(s): "TSH", "FREET4" in the last 168 hours.  BNPNo results for input(s): "BNP", "PROBNP" in the last 168 hours.  DDimer No results for input(s): "DDIMER" in the last 168 hours.   Radiology/Studies:  DG FEMUR, MIN 2 VIEWS RIGHT  Result Date:  12/21/2022 CLINICAL DATA:  Status post internal fixation of right hip fracture. EXAM: RIGHT FEMUR 2 VIEWS COMPARISON:  None Available. FINDINGS: Right hip screws and femoral intramedullary rod with distal fixation screws are seen transfixing an intertrochanteric right hip fracture in near anatomic alignment. No evidence of dislocation. Knee prosthesis also noted. IMPRESSION: Internal fixation of intertrochanteric right hip fracture in near anatomic alignment. Electronically Signed   By: Marlaine Hind M.D.   On: 12/21/2022 10:20   DG C-Arm 1-60 Min-No Report  Result Date: 12/21/2022 Fluoroscopy was utilized by the requesting physician.  No radiographic interpretation.   DG C-Arm 1-60 Min-No Report  Result Date: 12/21/2022 Fluoroscopy was utilized by the requesting physician.  No radiographic interpretation.     Assessment and Plan:   Postoperative atrial fibrillation: Mr. Menna was noted to be tachycardic this afternoon during routine vital check and appears to be in atrial fibrillation with rapid ventricular response.  Exact duration of atrial fibrillation is uncertain, though heart rates were fairly normal at 10:51 this morning but jumped to 121 bpm at 128 this afternoon.  Patient also had low-grade fever earlier today.  Soft blood pressure limits rate control to some degree, though I think it would be reasonable to try a  low-dose metoprolol as a first-line agent. -Initiate metoprolol tartrate 12.5 mg every 6 hours for target resting heart rate less than 110 bpm. -If target heart rate not achieved and soft blood pressure precludes escalation of metoprolol or addition of diltiazem, IV amiodarone would be next line therapy. -Given advanced age and borderline creatinine clearance, I favor avoiding digoxin if possible. -Restart anticoagulation as soon as it is safe to do so from a postoperative blood loss standpoint. -Check TSH and magnesium level. -Obtain echocardiogram.   Risk Assessment/Risk Scores:   CHA2DS2-VASc Score = 4   This indicates a 4.8% annual risk of stroke. The patient's score is based upon: CHF History: 0 HTN History: 1 Diabetes History: 0 Stroke History: 0 Vascular Disease History: 1 Age Score: 2 Gender Score: 0  For questions or updates, please contact [place] Please consult [email or web address] for contact info under [facility].  Signed, Nelva Bush, MD  12/24/2022 5:16 PM

## 2022-12-24 NOTE — Plan of Care (Signed)
  Problem: Clinical Measurements: Goal: Will remain free from infection Outcome: Progressing   Problem: Clinical Measurements: Goal: Cardiovascular complication will be avoided Outcome: Progressing   Problem: Activity: Goal: Risk for activity intolerance will decrease Outcome: Progressing   Problem: Pain Managment: Goal: General experience of comfort will improve Outcome: Progressing   Problem: Elimination: Goal: Will not experience complications related to urinary retention Outcome: Progressing   Problem: Activity: Goal: Ability to ambulate and perform ADLs will improve Outcome: Progressing

## 2022-12-24 NOTE — TOC Progression Note (Signed)
Transition of Care [facility]) - Progression Note    Patient Details  Name: Curtis West MRN: [id number] Date of Birth: 06-Dec-1932  Transition of Care [facility] CM/SW [place], RN Phone Number: 12/24/2022, 2:35 PM  Clinical Narrative:    Reviewed the bed offers with the son Shanon Brow, I encouraged him to review medicare.gov He will talk with his wife and call me with a choice, I explained he also will need Ins approval         Expected Discharge Plan and Services                                               Social Determinants of Health (SDOH) Interventions SDOH Screenings   Tobacco Use: Medium Risk (12/23/2022)    Readmission Risk Interventions     No data to display

## 2022-12-24 NOTE — Consult Note (Signed)
[place] for Eliquis Indication: DVT  Allergies  Allergen Reactions   Amoxicillin    Sulfa Antibiotics     "I get loopy"    Patient Measurements: Height: 6' (182.9 cm) Weight: 86.2 kg (190 lb) IBW/kg (Calculated) : 77.6  Vital Signs: Temp: 97.9 F (36.6 C) (01/16 1653) Temp Source: Axillary (01/16 1328) BP: 95/57 (01/16 1653) Pulse Rate: 124 (01/16 1653)  Labs: Recent Labs    12/22/22 0545 12/23/22 0556 12/23/22 1533 12/24/22 0229 12/24/22 1447  HGB 7.6* 6.7* 8.2* 7.2* 9.7*  HCT 24.1* 20.8* 25.3* 22.3* 30.1*  PLT 146* 156  --  163  --   CREATININE 1.08 1.10  --   --   --     Estimated Creatinine Clearance: 50.9 mL/min (by C-G formula based on SCr of 1.1 mg/dL).   Medical History: Past Medical History:  Diagnosis Date   Allergy    Seasonal Allergies   Alzheimer disease (HCC)    Arthritis    Asthma    GERD (gastroesophageal reflux disease)    Hyperlipidemia    Hypertension    Rosacea    Skin cancer     Medications:  Apixaban 5 mg twice daily  Assessment: 89 yo M with with PMH HTN, HLD, DVT (05/2022), dementia presents with hip pain after an unwitnessed fall. Patient is uncertain whether he hit his head. Imaging negative for acute intracranial injury or spinal fracture. Patient takes apixaban 5 mg twice daily PTA due to DVT in 05/2022. Hgb has trended down during admission, from 8.2 >> 7.2 and patient is now s/p transfusion. Hgb has recovered, now 9.7. Pharmacy has been consulted to resume apixaban.  Plan:  Resume apixaban 5 mg twice daily Monitor CBC at least every 3 days due to recent Hgb downtrend  Will M. Ouida Sills, PharmD PGY-1 Pharmacy Resident 12/24/2022 5:26 PM

## 2022-12-24 NOTE — Progress Notes (Addendum)
Progress Note   Patient: Curtis West IWL:[id number] DOB: [birth year] DOA: 12/20/2022     4 DOS: the patient was seen and examined on 12/24/2022   Brief hospital course: Taken from H&P.  Curtis West is a 89 y.o. male with medical history significant of hypertension, hyperlipidemia, arthritis, DVT on Eliquis, and Alzheimer's dementia, who presents with unwitnessed fall at memory care unit and right hip pain.  No loss of consciousness.   ED course and data reviewed.  pt was found to have WBC 6.4, GFR> 60, temperature normal, blood pressure 98/48, heart rate 71, RR 16, oxygen saturation 100% on room air.  Chest x-ray negative.  CT of the head negative.  CT of C-spine negative for acute injury.  X-ray of right hip/pelvis showed comminuted, mildly displaced intertrochanteric fracture of the right femur.  He was also having difficulty urinating in ED so Foley catheter was placed.  Orthopedic was consulted and patient will be going to the OR on 1/13.  1/13: Vitals with borderline blood pressure at 95/48, s/p ORIF earlier today.  Patient had significant blood loss during surgery and hemoglobin dropped to 6.8.  1 unit of PRBC ordered.  1/14: Vitals within normal limit.  Hemoglobin at 7.6 after improving to 8.8 s/p 1 unit of PRBC. Leukocytosis has been resolved.  Will hold Eliquis for another day, most likely will resume from tomorrow if hemoglobin remained stable.  Also starting him on iron supplement No prior history of urinary retention so Foley catheter will be removed today to give him a voiding trial.  PT is recommending SNF.  Son was very concerned that he will not do good if was sent somewhere new without his wife, he and his wife apparently living together in a memory care unit. Asked TOC to find out if his current memory care unit can provide PT and OT so he can remain in the prior setting.  1/15: Vitals stable.  Hemoglobin with further decreased to 6.7, sodium with persistent mild  hyponatremia at 132.  Orthopedic surgery ordered 1 more unit of PRBC. We will keep holding Eliquis until hemoglobin stabilizes. Anemia panel with iron deficiency, borderline folate and B12 pending.  Increasing the frequency of supplement to twice daily.   1/16: Hemoglobin again decreased to 7.2 after improving to 8.2 with getting 1 unit of PRBC yesterday.  Ordered another unit and 2 doses of IV iron.  Pending SNF placement.  Addendum.  4:30 PM.  Notified by nursing staff that patient developed tachycardia during vitals check.  EKG obtained and it shows new onset A-fib with RVR.  Gave one-time dose of metoprolol IV, borderline soft blood pressure.  Consulted cardiology, talk with Dr. Saunders Revel with [facility]. Patient is not on any anticoagulation since surgery due to decreasing hemoglobin requiring blood transfusions.  No prior history of A-fib.  Cardiology is trying low-dose metoprolol. If heart rate remained above 120 then he need to transfer to progressive care for amiodarone infusion.  Also restarting Eliquis as patient was without any anticoagulation for the past few days with history of DVT and recent hip fracture.   Assessment and Plan: * Closed right hip fracture (HCC) S/p ORIF.  Patient had significant blood loss during surgery. Clinically seems improving.  PT is recommending SNF -Continue with pain management   Fall at home, initial encounter -PT/OT evaluation  Normocytic anemia Acute blood loss anemia with surgery. Hemoglobin again dropped to 7.1 after improving to 8.2 with PRBC, no obvious bleeding.  Ordered another unit of PRBC.  Anemia panel also with iron deficiency with borderline folic acid and A19 pending. -1 more unit of PRBC -IV iron also ordered -Increasing the dose of supplement to twice daily -Monitor hemoglobin -Transfuse if below 7 -Hold restarting Eliquis until hemoglobin stabilizes  HTN (hypertension) Patient is not on any medications at home.  Blood pressure within  goal -Continue to monitor  DVT, lower extremity ([place]) Patient was on Eliquis at home which is currently being held, initially for surgery. -Keep holding Eliquis for another day, can resume tomorrow if hemoglobin remained stable.  Asthma without status asthmaticus -No acute concern -As needed bronchodilators  Depression with anxiety -Continue home meds   Subjective: Patient was sleeping comfortably when seen today.  Received pain medications little earlier.  Arousable but going back to sleep pretty quickly.  Physical Exam: Vitals:   12/24/22 1328 12/24/22 1329 12/24/22 1530 12/24/22 1653  BP: 109/82 109/82 102/71 (!) 95/57  Pulse: (!) 121 (!) 121 (!) 146 (!) 124  Resp: 17  17   Temp: 98.5 F (36.9 C) 98.5 F (36.9 C) 98.7 F (37.1 C) 97.9 F (36.6 C)  TempSrc: Axillary     SpO2: 100% 100% 93% 94%  Weight:      Height:       General.  Frail gentleman, in no acute distress. Pulmonary.  Lungs clear bilaterally, normal respiratory effort. CV.  Regular rate and rhythm, no JVD, rub or murmur. Abdomen.  Soft, nontender, nondistended, BS positive. CNS.  Somnolent.  No focal neurologic deficit. Extremities.  No edema, no cyanosis, pulses intact and symmetrical. Psychiatry.  Judgment and insight appears impaired.  Data Reviewed: Prior data reviewed  Family Communication: Discussed with son on phone.  Disposition: Status is: Inpatient Remains inpatient appropriate because: Severity of illness  Planned Discharge Destination: Skilled nursing facility  DVT prophylaxis.  Heparin Time spent: 40 minutes  This record has been created using Systems analyst. Errors have been sought and corrected,but may not always be located. Such creation errors do not reflect on the standard of care.   Author: Lorella Nimrod, MD 12/24/2022 5:06 PM  For on call review [email or web address].

## 2022-12-25 DIAGNOSIS — S72001S Fracture of unspecified part of neck of right femur, sequela: Secondary | ICD-10-CM

## 2022-12-25 DIAGNOSIS — I4891 Unspecified atrial fibrillation: Secondary | ICD-10-CM | POA: Insufficient documentation

## 2022-12-25 DIAGNOSIS — S72001A Fracture of unspecified part of neck of right femur, initial encounter for closed fracture: Secondary | ICD-10-CM | POA: Diagnosis not present

## 2022-12-25 LAB — ECHOCARDIOGRAM COMPLETE
AR max vel: 2.73 cm2
AV Area VTI: 2.48 cm2
AV Area mean vel: 2.55 cm2
AV Mean grad: 6.4 mmHg
AV Peak grad: 10.9 mmHg
Ao pk vel: 1.65 m/s
Height: 72 in
S' Lateral: 2.5 cm
Weight: 3040 [oz_av]

## 2022-12-25 LAB — TYPE AND SCREEN
ABO/RH(D): O NEG
Antibody Screen: NEGATIVE
Unit division: 0

## 2022-12-25 LAB — CBC
HCT: 28.3 % — ABNORMAL LOW (ref 39.0–52.0)
Hemoglobin: 9 g/dL — ABNORMAL LOW (ref 13.0–17.0)
MCH: 26.5 pg (ref 26.0–34.0)
MCHC: 31.8 g/dL (ref 30.0–36.0)
MCV: 83.5 fL (ref 80.0–100.0)
Platelets: 217 K/uL (ref 150–400)
RBC: 3.39 MIL/uL — ABNORMAL LOW (ref 4.22–5.81)
RDW: 15.7 % — ABNORMAL HIGH (ref 11.5–15.5)
WBC: 7.9 K/uL (ref 4.0–10.5)
nRBC: 0 % (ref 0.0–0.2)

## 2022-12-25 LAB — BPAM RBC
Blood Product Expiration Date: 202402222359
ISSUE DATE / TIME: 202401161017
Unit Type and Rh: 5100

## 2022-12-25 LAB — BASIC METABOLIC PANEL WITH GFR
Anion gap: 5 (ref 5–15)
BUN: 25 mg/dL — ABNORMAL HIGH (ref 8–23)
CO2: 23 mmol/L (ref 22–32)
Calcium: 8 mg/dL — ABNORMAL LOW (ref 8.9–10.3)
Chloride: 105 mmol/L (ref 98–111)
Creatinine, Ser: 1.18 mg/dL (ref 0.61–1.24)
GFR, Estimated: 59 mL/min — ABNORMAL LOW
Glucose, Bld: 116 mg/dL — ABNORMAL HIGH (ref 70–99)
Potassium: 4.1 mmol/L (ref 3.5–5.1)
Sodium: 133 mmol/L — ABNORMAL LOW (ref 135–145)

## 2022-12-25 LAB — MAGNESIUM: Magnesium: 2.2 mg/dL (ref 1.7–2.4)

## 2022-12-25 LAB — TSH: TSH: 5.71 u[IU]/mL — ABNORMAL HIGH (ref 0.350–4.500)

## 2022-12-25 MED ORDER — METOPROLOL TARTRATE 25 MG PO TABS
25.0000 mg | ORAL_TABLET | Freq: Four times a day (QID) | ORAL | Status: DC
  Administered 2022-12-25 – 2022-12-26 (×3): 25 mg via ORAL
  Filled 2022-12-25 (×3): qty 1

## 2022-12-25 NOTE — Progress Notes (Signed)
Rounding Note    Patient Name: Curtis West Date of Encounter: 12/25/2022  [place] Cardiologist: None  New consult done by Dr End Subjective   Patient seen on rounds. Blood pressure is better controlled but heart rate remains elevated. Remains in atrial fibrillation on telemetry.  Inpatient Medications    Scheduled Meds:  (feeding supplement) PROSource Plus  30 mL Oral BID BM   apixaban  5 mg Oral BID   docusate sodium  100 mg Oral BID   Fe Fum-Vit C-Vit B12-FA  1 capsule Oral BID WC   feeding supplement  237 mL Oral TID BM   fluticasone  1 spray Each Nare BID   lidocaine  1 patch Transdermal Q24H   melatonin  5 mg Oral QHS   metoprolol tartrate  12.5 mg Oral Q6H   multivitamin with minerals  1 tablet Oral Daily   QUEtiapine  50 mg Oral QHS   sertraline  25 mg Oral Daily   Continuous Infusions:  ferric gluconate (FERRLECIT) IVPB 135 mL/hr at 12/24/22 1816   PRN Meds: acetaminophen, hydrALAZINE, HYDROcodone-acetaminophen, HYDROcodone-acetaminophen, LORazepam, menthol-cetylpyridinium **OR** phenol, methocarbamol, metoCLOPramide **OR** metoCLOPramide (REGLAN) injection, morphine injection, ondansetron **OR** ondansetron (ZOFRAN) IV, senna-docusate   Vital Signs    Vitals:   12/24/22 2037 12/24/22 2328 12/25/22 0534 12/25/22 0901  BP: 111/73 102/76 (!) 92/50 139/78  Pulse: (!) 101 (!) 106 80 (!) 102  Resp: '18 18  18  '$ Temp: 100.3 F (37.9 C) 98.4 F (36.9 C)  97.9 F (36.6 C)  TempSrc:      SpO2: 94% 94%  95%  Weight:      Height:        Intake/Output Summary (Last 24 hours) at 12/25/2022 1231 Last data filed at 12/25/2022 0002 Gross per 24 hour  Intake 552.65 ml  Output 300 ml  Net 252.65 ml      12/20/2022    8:04 AM 01/29/2021    6:26 AM 09/30/2020    5:52 AM  Last 3 Weights  Weight (lbs) 190 lb 200 lb 205 lb  Weight (kg) 86.183 kg 90.719 kg 92.987 kg      Telemetry    Atrial fibrillation rates of 110-130 - Personally  Reviewed  ECG    No new tracings - Personally Reviewed  Physical Exam   GEN: No acute distress.   Neck: No JVD Cardiac: irregularly irregular, no murmurs, rubs, or gallops.  Respiratory: Clear to auscultation bilaterally. Respirations are unlabored on room air GI: Soft, nontender, non-distended  MS: No edema; No deformity. Neuro:  Nonfocal  Psych: Remains confused with baseline of dementia  Labs    High Sensitivity Troponin:  No results for input(s): "TROPONINIHS" in the last 720 hours.   Chemistry Recent Labs  Lab 12/22/22 0545 12/23/22 0556 12/25/22 0453  NA 133* 132* 133*  K 3.9 4.1 4.1  CL 105 105 105  CO2 '24 23 23  '$ GLUCOSE 116* 117* 116*  BUN 20 20 25*  CREATININE 1.08 1.10 1.18  CALCIUM 7.8* 7.7* 8.0*  MG  --   --  2.2  GFRNONAA >60 >60 59*  ANIONGAP 4* 4* 5    Lipids No results for input(s): "CHOL", "TRIG", "HDL", "LABVLDL", "LDLCALC", "CHOLHDL" in the last 168 hours.  Hematology Recent Labs  Lab 12/23/22 0556 12/23/22 1533 12/24/22 0229 12/24/22 1447 12/25/22 0453  WBC 7.5  --  7.5  --  7.9  RBC 2.57*  2.59*  --  2.70*  --  3.39*  HGB 6.7*   < > 7.2* 9.7* 9.0*  HCT 20.8*   < > 22.3* 30.1* 28.3*  MCV 80.9  --  82.6  --  83.5  MCH 26.1  --  26.7  --  26.5  MCHC 32.2  --  32.3  --  31.8  RDW 14.8  --  15.6*  --  15.7*  PLT 156  --  163  --  217   < > = values in this interval not displayed.   Thyroid  Recent Labs  Lab 12/25/22 0453  TSH 5.710*    BNPNo results for input(s): "BNP", "PROBNP" in the last 168 hours.  DDimer No results for input(s): "DDIMER" in the last 168 hours.   Radiology      Cardiac Studies  TTE 12/24/22  1. Left ventricular ejection fraction, by estimation, is 60 to 65%. The  left ventricle has normal function. The left ventricle has no regional  wall motion abnormalities. Left ventricular diastolic function could not  be evaluated.   2. Right ventricular systolic function is normal. The right ventricular  size is  mildly enlarged. There is mildly elevated pulmonary artery  systolic pressure.   3. The mitral valve is degenerative. Mild mitral valve regurgitation. No  evidence of mitral stenosis. Moderate mitral annular calcification.   4. The aortic valve is tricuspid. There is mild calcification of the  aortic valve. There is mild thickening of the aortic valve. Aortic valve  regurgitation is trivial. Aortic valve sclerosis/calcification is present,  without any evidence of aortic  stenosis.   5. There is mild dilatation of the aortic root, measuring 39 mm.   6. The inferior vena cava is dilated in size with <50% respiratory  variability, suggesting right atrial pressure of 15 mmHg.   Patient Profile     89 y.o. male with a history of hypertension, hyperlipidemia, DVT on chronic anticoagulation with apixaban, and Alzheimer's dementia, who is being seen and evaluated for postoperative atrial fibrillation.   Assessment & Plan    New onset atrial fibrillation postoperatively -Remains in atrial fibrillation on the telemetry monitor -metoprolol increased to 25 mg every 6 hours for better rate control for target resting heart rate is less than 110 bpm -blood pressures at this morning had slightly improved -Continue to monitor blood pressure closely -continued on apixaban 5 mg bid for history of chronic DVT and now for stroke prophylaxis for atrial fibrillation -Echocardiogram completed revealed LVEF of 60-65%, with no regional wall motion abnormalities, and mild mitral regurgitation -Continue telemetry monitoring -TSH 5.710 -Magnesium 2.2 -Will continue to avoid digoxin given age and borderline creatinine clearance -Continue to monitor hemoglobin and transfuse for less than 8 -At target heart rate continues to not be achieved due to soft blood pressure with the escalation of metoprolol or addition of diltiazem may have to move to IV amiodarone therapy  CHA2DS2-VASc Score = 4   This indicates a 4.8%  annual risk of stroke. The patient's score is based upon: CHF History: 0 HTN History: 1 Diabetes History: 0 Stroke History: 0 Vascular Disease History: 1 Age Score: 2 Gender Score: 0      For questions or updates, please contact [place] Please consult [email or web address] for contact info under        Signed, Kolson Chovanec, NP  12/25/2022, 12:31 PM

## 2022-12-25 NOTE — Plan of Care (Signed)
  Problem: Safety: Goal: Ability to remain free from injury will improve Outcome: Progressing   Problem: Pain Management: Goal: Pain level will decrease Outcome: Progressing

## 2022-12-25 NOTE — TOC Progression Note (Signed)
Transition of Care [facility]) - Progression Note    Patient Details  Name: Curtis West MRN: [id number] Date of Birth: [birth year]  Transition of Care [facility]) CM/SW [place], RN Phone Number: 12/25/2022, 2:16 PM  Clinical Narrative:     [facility] the' \\bed'$  [facility], Ins approved to go to [facility], 1/17-1/18 ref number [id number]  Expected Discharge Plan: [place] Barriers to Discharge: SNF Pending bed offer, Insurance Authorization  Expected Discharge Plan and Services   Discharge Planning Services: CM Consult   Living arrangements for the past 2 months: [place]                                       Social Determinants of Health (SDOH) Interventions SDOH Screenings   Tobacco Use: Medium Risk (12/23/2022)    Readmission Risk Interventions     No data to display

## 2022-12-25 NOTE — TOC Progression Note (Addendum)
Transition of Care [facility]) - Progression Note    Patient Details  Name: Curtis West MRN: [id number] Date of Birth: 1931-12-15  Transition of Care [facility]) CM/SW [place], RN Phone Number: 12/25/2022, 10:40 AM  Clinical Narrative:    Spoke wit son Shanon Brow  late yesterday, He tells me that he spoke with [facility] and [facility] and was hoping to get the patient into one, I explained it is often difficult to get into [facility] due to beds being full, he tells me that his parents are on the watting list for memory care, I reached out to Seth Bake and asked if she could review [facility] can not accept the patient this week , they have no beds       Expected Discharge Plan and Services                                               Social Determinants of Health (SDOH) Interventions SDOH Screenings   Tobacco Use: Medium Risk (12/23/2022)    Readmission Risk Interventions     No data to display

## 2022-12-25 NOTE — TOC Progression Note (Signed)
Transition of Care [facility]) - Progression Note    Patient Details  Name: Curtis West MRN: [id number] Date of Birth: 04/29/1932  Transition of Care [facility]) CM/SW [place], RN Phone Number: 12/25/2022, 12:17 PM  Clinical Narrative:   Spoke with the son Shanon Brow, he stated that [facility] has told him they do not have a bed he will likely DC Friday and needs a bed, The son is hoping to get [facility] [place], I reached out to Ryan and am awaiting a call back    Expected Discharge Plan: [place] Barriers to Discharge: SNF Pending bed offer, Insurance Authorization  Expected Discharge Plan and Services   Discharge Planning Services: CM Consult   Living arrangements for the past 2 months: [place]                                       Social Determinants of Health (SDOH) Interventions SDOH Screenings   Tobacco Use: Medium Risk (12/23/2022)    Readmission Risk Interventions     No data to display

## 2022-12-25 NOTE — TOC Progression Note (Signed)
Transition of Care [facility]) - Progression Note    Patient Details  Name: Curtis West MRN: [id number] Date of Birth: [birth year]  Transition of Care [facility]) CM/SW [place], RN Phone Number: 12/25/2022, 2:04 PM  Clinical Narrative:    Damaris Schooner with Barnett Applebaum at [facility], they are offering a bed, The son Shanon Brow stated that was their preference and they will accept the bed, I notified Gina   Expected Discharge Plan: Skilled Nursing Facility Barriers to Discharge: SNF Pending bed offer, Insurance Authorization  Expected Discharge Plan and Services   Discharge Planning Services: CM Consult   Living arrangements for the past 2 months: [place]                                       Social Determinants of Health (SDOH) Interventions SDOH Screenings   Tobacco Use: Medium Risk (12/23/2022)    Readmission Risk Interventions     No data to display

## 2022-12-25 NOTE — Progress Notes (Signed)
Physical Therapy Treatment Patient Details Name: Curtis West MRN: [id number] DOB: 06-10-1932 Today's Date: 12/25/2022   History of Present Illness Patient is a 89 year old male with closed right hip fracture s/p IM nail following a fall at memory care unit. History of Alzheimer's dementia, HTN, arthritis, DVT.    PT Comments    Patient continues to require +2 person assistance for mobility tasks. He was able to stand x 2 bouts with Max A +2 person using rolling walker with the bed at an elevated bed height. He was unable to stand more than 10 seconds with cues for upright standing posture. Unable to take any steps safely at this time. He was requesting to have a bowel movement and was placed on the bed pan with no results. Recommend to continue PT to maximize independence and decrease caregiver burden. SNF will be needed at discharge.    Recommendations for follow up therapy are one component of a multi-disciplinary discharge planning process, led by the attending physician.  Recommendations may be updated based on patient status, additional functional criteria and insurance authorization.  Follow Up Recommendations  Skilled nursing-short term rehab (<3 hours/day) Can patient physically be transported by private vehicle: No   Assistance Recommended at Discharge Frequent or constant Supervision/Assistance  Patient can return home with the following Two people to help with walking and/or transfers;A lot of help with bathing/dressing/bathroom;Help with stairs or ramp for entrance;Assist for transportation   Equipment Recommendations   (to be determined at next level of care)    Recommendations for Other Services       Precautions / Restrictions Precautions Precautions: Fall Restrictions Weight Bearing Restrictions: Yes RLE Weight Bearing: Weight bearing as tolerated     Mobility  Bed Mobility Overal bed mobility: Needs Assistance Bed Mobility: Rolling, Supine to Sit, Sit to  Supine Rolling: Max assist, +2 for physical assistance   Supine to sit: Max assist, +2 for physical assistance Sit to supine: Max assist, +2 for physical assistance   General bed mobility comments: assistance required with all mobility. rolling performed in order to place patient on the bed pan as he requesting to have a bowel movement.    Transfers Overall transfer level: Needs assistance Equipment used: Rolling walker (2 wheels) Transfers: Sit to/from Stand Sit to Stand: Max assist, +2 physical assistance, From elevated surface           General transfer comment: 2 bouts of standing performed with the bed at an elevated height and maximal cues for techniques. limited standing tolerance for progression of ambulation    Ambulation/Gait               General Gait Details: unable to   Stairs             Wheelchair Mobility    Modified Rankin (Stroke Patients Only)       Balance Overall balance assessment: Needs assistance Sitting-balance support: Feet supported Sitting balance-Leahy Scale: Poor Sitting balance - Comments: poor progressing to fair with increased sitting time   Standing balance support: Bilateral upper extremity supported Standing balance-Leahy Scale: Zero Standing balance comment: +2 person assistance required to maintain standing balance with rolling walker for UE support. trunk flexed and cues for upright standing posture. standing tolerance of less than 10 seconds                            Cognition Arousal/Alertness: Awake/alert Behavior During Therapy: Flat affect Overall  Cognitive Status: History of cognitive impairments - at baseline                                 General Comments: patient intermittently yelling out with mobility efforts. frequent reassurance and encouragement needed. the patient has very limited short term memory and is unable to recall at any point why he is in the hospital.         Exercises      General Comments General comments (skin integrity, edema, etc.): mittens were removed during session. patient was set-up with breakfast tray and feeding himself french toast at end of session. alerted the nurse      Pertinent Vitals/Pain Pain Assessment Pain Assessment: No/denies pain    Home Living                          Prior Function            PT Goals (current goals can now be found in the care plan section) Acute Rehab PT Goals Patient Stated Goal: to get up PT Goal Formulation: With patient Time For Goal Achievement: 01/05/23 Potential to Achieve Goals: Poor Progress towards PT goals: Progressing toward goals    Frequency    7X/week      PT Plan Current plan remains appropriate    Co-evaluation              AM-PAC PT "6 Clicks" Mobility   Outcome Measure  Help needed turning from your back to your side while in a flat bed without using bedrails?: A Lot Help needed moving from lying on your back to sitting on the side of a flat bed without using bedrails?: A Lot Help needed moving to and from a bed to a chair (including a wheelchair)?: Total Help needed standing up from a chair using your arms (e.g., wheelchair or bedside chair)?: Total Help needed to walk in hospital room?: Total Help needed climbing 3-5 steps with a railing? : Total 6 Click Score: 8    End of Session   Activity Tolerance: Patient limited by pain (limited due to cognition) Patient left: in bed;with call bell/phone within reach;with bed alarm set Nurse Communication: Mobility status PT Visit Diagnosis: Difficulty in walking, not elsewhere classified (R26.2);Pain Pain - Right/Left: Right Pain - part of body: Hip     Time: 0931-1216 PT Time Calculation (min) (ACUTE ONLY): 28 min  Charges:  $Therapeutic Activity: 23-37 mins                     Minna Merritts, PT, MPT    Percell Locus 12/25/2022, 10:53 AM

## 2022-12-25 NOTE — Progress Notes (Signed)
PROGRESS NOTE  Curtis West QAS:[id number] DOB: August 30, 1932 DOA: 12/20/2022 PCP: [place] Pink, MD  Hospital Course/Subjective: Curtis West is a 89 y.o. male with medical history significant of hypertension, hyperlipidemia, arthritis, DVT on Eliquis, and Alzheimer's dementia, who presents with unwitnessed fall at memory care unit and right hip pain.  He was diagnosed with closed R hip fracture, seen by orthopedic surgery and underwent ORIF on 1/13. He tolerated this well but required total of 3 units of blood transfusion since surgery. Has otherwise been doing well, but developed new onset AFib with RVR on 1/16. He was seen by cardiology, started on low dose metoprolol for rate control and his Eliquis was resumed due to history of DVT, recent hip surgery and now new onset Afib.  This morning he is stable, remains on tele which shows AFib 120s. This may be exacerbated by his confusion/dementia as a result of which he is calling out.  Assessment/Plan:  Principal Problem:   Closed right hip fracture [facility]) Active Problems:   Fall at home, initial encounter   Normocytic anemia   HTN (hypertension)   DVT, lower extremity ([place])   Asthma without status asthmaticus   Depression with anxiety   Malnutrition of moderate degree   New onset a-fib (HCC)   Assessment and Plan: * Closed right hip fracture (HCC) S/p ORIF.  Patient had significant blood loss during surgery. Clinically seems improving.  PT is recommending SNF - Continue with pain management and await placement  New Onset AFib with RVR -discovered on the afternoon of 1/16, confirmed with EKG - seen by cardiology started on Metoprolol for rate control - no hypoxia, chest pain, etc -- if these develop would need CTA chest - was likely also instigated by his anemia - restarted home Eliquis as well  Fall at home, initial encounter -PT/OT evaluation recommends SNF  Normocytic anemia Acute blood loss anemia with  surgery. Hemoglobin again dropped to 7.1 after improving to 8.2 with PRBC, no obvious bleeding.  Ordered another unit of PRBC.  Anemia panel also with iron deficiency with borderline folic acid and N98 pending. Eliquis was held for Hb to stabilize but was restarted 1/16 due to Afib. -1 more unit of PRBC -IV iron also ordered to start this AM -Increasing the dose of supplement to twice daily -Monitor hemoglobin -Transfuse if below 7  HTN (hypertension) Patient is not on any medications at home.  Blood pressure within goal -Continue to monitor  DVT, lower extremity ([place]) Patient was on Eliquis at home which was being held due to postop anemia.  Was restarted 1/16 due to Afib, history of DVT and recent surgery.  Asthma without status asthmaticus -No acute concern -As needed bronchodilators  Depression with anxiety -Continue home meds  DVT Prophylaxis: Eliquis  Code Status: DNR   Family Communication:  Called and updated son and daughter in law over the phone today.   Disposition Plan: SNF   Consultants: Cardiology Orthopedics  Procedures: ORIF 1/13   Antimicrobials: Anti-infectives (From admission, onward)    Start     Dose/Rate Route Frequency Ordered Stop   12/21/22 1200  ceFAZolin (ANCEF) IVPB 2g/100 mL premix        2 g 200 mL/hr over 30 Minutes Intravenous Every 6 hours 12/21/22 1108 12/21/22 2209   12/21/22 0742  gentamicin 80 mg in 0.9% sodium chloride 250 mL irrigation  Status:  Discontinued          As needed 12/21/22 0743 12/21/22 0940   12/21/22  4650  ceFAZolin (ANCEF) 2-4 GM/100ML-% IVPB       Note to Pharmacy: Mirna Mires: cabinet override      12/21/22 0724 12/21/22 2209   12/21/22 0700  ceFAZolin (ANCEF) IVPB 2g/100 mL premix        2 g 200 mL/hr over 30 Minutes Intravenous To Surgery 12/20/22 1835 12/21/22 0815      Objective: Vitals:   12/24/22 2037 12/24/22 2328 12/25/22 0534 12/25/22 0901  BP: 111/73 102/76 (!) 92/50 139/78  Pulse:  (!) 101 (!) 106 80 (!) 102  Resp: '18 18  18  '$ Temp: 100.3 F (37.9 C) 98.4 F (36.9 C)  97.9 F (36.6 C)  TempSrc:      SpO2: 94% 94%  95%  Weight:      Height:        Intake/Output Summary (Last 24 hours) at 12/25/2022 1144 Last data filed at 12/25/2022 0002 Gross per 24 hour  Intake 552.65 ml  Output 300 ml  Net 252.65 ml   Filed Weights   12/20/22 0804  Weight: 86.2 kg   Exam: General:  Alert, oriented to self only, pleasant and cooperative  Eyes: EOMI, clear sclerea Neck: supple, no masses, trachea mildline  Cardiovascular: RRR, no murmurs or rubs, no peripheral edema  Respiratory: clear to auscultation bilaterally, no wheezes, no crackles  Abdomen: soft, nontender, nondistended, normal bowel tones heard  Skin: dry, no rashes  Musculoskeletal: no joint effusions, normal range of motion  Psychiatric: appropriate affect, normal speech  Neurologic: extraocular muscles intact, clear speech, moving all extremities with intact sensorium   Data Reviewed: CBC: Recent Labs  Lab 12/20/22 0834 12/20/22 1215 12/21/22 2221 12/22/22 0545 12/23/22 0556 12/23/22 1533 12/24/22 0229 12/24/22 1447 12/25/22 0453  WBC 6.4   < > 9.6 8.8 7.5  --  7.5  --  7.9  NEUTROABS 3.6  --   --   --   --   --   --   --   --   HGB 9.1*   < > 7.6* 7.6* 6.7* 8.2* 7.2* 9.7* 9.0*  HCT 30.2*   < > 24.2* 24.1* 20.8* 25.3* 22.3* 30.1* 28.3*  MCV 82.3   < > 83.2 81.7 80.9  --  82.6  --  83.5  PLT 260   < > 148* 146* 156  --  163  --  217   < > = values in this interval not displayed.   Basic Metabolic Panel: Recent Labs  Lab 12/20/22 0909 12/21/22 0632 12/22/22 0545 12/23/22 0556 12/25/22 0453  NA 135 134* 133* 132* 133*  K 4.1 4.1 3.9 4.1 4.1  CL 104 105 105 105 105  CO2 '23 23 24 23 23  '$ GLUCOSE 131* 132* 116* 117* 116*  BUN '15 17 20 20 '$ 25*  CREATININE 1.10 1.05 1.08 1.10 1.18  CALCIUM 8.2* 7.9* 7.8* 7.7* 8.0*  MG  --   --   --   --  2.2   GFR: Estimated Creatinine Clearance: 47.5  mL/min (by C-G formula based on SCr of 1.18 mg/dL). Liver Function Tests: No results for input(s): "AST", "ALT", "ALKPHOS", "BILITOT", "PROT", "ALBUMIN" in the last 168 hours. No results for input(s): "LIPASE", "AMYLASE" in the last 168 hours. No results for input(s): "AMMONIA" in the last 168 hours. Coagulation Profile: Recent Labs  Lab 12/20/22 1216  INR 1.2   Cardiac Enzymes: No results for input(s): "CKTOTAL", "CKMB", "CKMBINDEX", "TROPONINI" in the last 168 hours. BNP (last 3 results) No results for  input(s): "PROBNP" in the last 8760 hours. HbA1C: No results for input(s): "HGBA1C" in the last 72 hours. CBG: No results for input(s): "GLUCAP" in the last 168 hours. Lipid Profile: No results for input(s): "CHOL", "HDL", "LDLCALC", "TRIG", "CHOLHDL", "LDLDIRECT" in the last 72 hours. Thyroid Function Tests: Recent Labs    12/25/22 0453  TSH 5.710*   Anemia Panel: Recent Labs    12/23/22 0556  FOLATE 8.1  FERRITIN 29  TIBC 291  IRON 9*  RETICCTPCT 2.2   Urine analysis:    Component Value Date/Time   COLORURINE YELLOW (A) 12/15/2019 1551   APPEARANCEUR CLEAR (A) 12/15/2019 1551   LABSPEC 1.019 12/15/2019 1551   PHURINE 6.0 12/15/2019 1551   GLUCOSEU NEGATIVE 12/15/2019 1551   HGBUR NEGATIVE 12/15/2019 1551   [place] 12/15/2019 1551   KETONESUR NEGATIVE 12/15/2019 1551   PROTEINUR 30 (A) 12/15/2019 1551   NITRITE NEGATIVE 12/15/2019 1551   LEUKOCYTESUR NEGATIVE 12/15/2019 1551   Sepsis Labs: '@LABRCNTIP'$ (procalcitonin:4,lacticidven:4)  ) Recent Results (from the past 240 hour(s))  MRSA Next Gen by PCR, Nasal     Status: None   Collection Time: 12/21/22 12:42 AM   Specimen: Nasal Mucosa; Nasal Swab  Result Value Ref Range Status   MRSA by PCR Next Gen NOT DETECTED NOT DETECTED Final    Comment: (NOTE) The GeneXpert MRSA Assay (FDA approved for NASAL specimens only), is one component of a comprehensive MRSA colonization surveillance program. It  is not intended to diagnose MRSA infection nor to guide or monitor treatment for MRSA infections. Test performance is not FDA approved in patients less than 7 years old. Performed at [facility], [address] [place], [place] [address]     Studies: ECHOCARDIOGRAM COMPLETE  Result Date: 12/25/2022    ECHOCARDIOGRAM REPORT   Patient Name:   Curtis West Date of Exam: 12/24/2022 Medical Rec #:  [id number]        Height:       72.0 in Accession #:    [id number]       Weight:       190.0 lb Date of Birth:  1932-07-21        BSA:          2.085 m Patient Age:    73 years         BP:           95/57 mmHg Patient Gender: M                HR:           106 bpm. Exam Location:  [facility] Procedure: 2D Echo, Cardiac Doppler and Color Doppler Indications:     I48.91 Atrial Fibrillation  History:         Patient has no prior history of Echocardiogram examinations.                  Risk Factors:Hypertension and Dyslipidemia.  Sonographer:     Cresenciano Lick RDCS Referring Phys:  [id number] CHRISTOPHER END Diagnosing Phys: Nelva Bush MD IMPRESSIONS  1. Left ventricular ejection fraction, by estimation, is 60 to 65%. The left ventricle has normal function. The left ventricle has no regional wall motion abnormalities. Left ventricular diastolic function could not be evaluated.  2. Right ventricular systolic function is normal. The right ventricular size is mildly enlarged. There is mildly elevated pulmonary artery systolic pressure.  3. The mitral valve is degenerative. Mild mitral valve regurgitation. No evidence of mitral stenosis. Moderate mitral  annular calcification.  4. The aortic valve is tricuspid. There is mild calcification of the aortic valve. There is mild thickening of the aortic valve. Aortic valve regurgitation is trivial. Aortic valve sclerosis/calcification is present, without any evidence of aortic stenosis.  5. There is mild dilatation of the aortic root, measuring 39 mm.  6. The  inferior vena cava is dilated in size with <50% respiratory variability, suggesting right atrial pressure of 15 mmHg.   Scheduled Meds:  (feeding supplement) PROSource Plus  30 mL Oral BID BM   apixaban  5 mg Oral BID   docusate sodium  100 mg Oral BID   Fe Fum-Vit C-Vit B12-FA  1 capsule Oral BID WC   feeding supplement  237 mL Oral TID BM   fluticasone  1 spray Each Nare BID   lidocaine  1 patch Transdermal Q24H   melatonin  5 mg Oral QHS   metoprolol tartrate  12.5 mg Oral Q6H   multivitamin with minerals  1 tablet Oral Daily   QUEtiapine  50 mg Oral QHS   sertraline  25 mg Oral Daily   Continuous Infusions:  ferric gluconate (FERRLECIT) IVPB 135 mL/hr at 12/24/22 1816    LOS: 5 days   Time spent: 31 minutes  Emaan Gary Marry Guan, MD [facility] Pager [phone]  If 7PM-7AM, please contact night-coverage [email or web address] Password [facility] 12/25/2022, 11:44 AM

## 2022-12-25 NOTE — Plan of Care (Signed)

## 2022-12-26 DIAGNOSIS — S72001A Fracture of unspecified part of neck of right femur, initial encounter for closed fracture: Secondary | ICD-10-CM | POA: Diagnosis not present

## 2022-12-26 LAB — THYROID PANEL
Free Thyroxine Index: 2 (ref 1.2–4.9)
T3 Uptake Ratio: 32 % (ref 24–39)
T4, Total: 6.4 ug/dL (ref 4.5–12.0)

## 2022-12-26 MED ORDER — SERTRALINE HCL 25 MG PO TABS: 25.0000 mg | ORAL_TABLET | Freq: Every day | ORAL | 0 refills | Status: DC

## 2022-12-26 MED ORDER — LIDOCAINE 5 % EX PTCH: 1.0000 | MEDICATED_PATCH | CUTANEOUS | 0 refills | Status: DC

## 2022-12-26 MED ORDER — METOPROLOL TARTRATE 50 MG PO TABS
50.0000 mg | ORAL_TABLET | Freq: Two times a day (BID) | ORAL | Status: DC
  Filled 2022-12-26: qty 1

## 2022-12-26 MED ORDER — DOCUSATE SODIUM 100 MG PO CAPS: 100.0000 mg | ORAL_CAPSULE | Freq: Two times a day (BID) | ORAL | 0 refills | Status: DC

## 2022-12-26 MED ORDER — HYDROCODONE-ACETAMINOPHEN 5-325 MG PO TABS: 1.0000 | ORAL_TABLET | ORAL | 0 refills | Status: DC | PRN

## 2022-12-26 MED ORDER — ENSURE ENLIVE PO LIQD: 237.0000 mL | Freq: Three times a day (TID) | ORAL | 12 refills | Status: DC

## 2022-12-26 MED ORDER — QUETIAPINE FUMARATE 50 MG PO TABS: 50.0000 mg | ORAL_TABLET | Freq: Every day | ORAL | 0 refills | Status: DC

## 2022-12-26 MED ORDER — METOPROLOL TARTRATE 50 MG PO TABS: 50.0000 mg | ORAL_TABLET | Freq: Two times a day (BID) | ORAL | 0 refills | Status: DC

## 2022-12-26 NOTE — Discharge Summary (Signed)
Discharge Summary  Curtis West ZSW:[id number] DOB: 10-17-1932  PCP: Maryland Pink, MD  Admit date: 12/20/2022 Discharge date: 12/26/2022  Recommendations for Outpatient Follow-up:  Please follow up with your PCP with CBC and BMP in 1-2 weeks Follow-up with cardiology Dr. Harrell Gave End  within the next 2 weeks.  Discharge Diagnoses:  Active Hospital Problems   Diagnosis Date Noted   Closed right hip fracture ([place]) 12/20/2022    Priority: High   Fall at home, initial encounter 12/20/2022    Priority: 1.   Normocytic anemia 02/03/2018    Priority: 2.   HTN (hypertension) 12/20/2022    Priority: 3.   DVT, lower extremity ([place]) 06/02/2022    Priority: 4.   Asthma without status asthmaticus 12/23/2019    Priority: 5.   Depression with anxiety 12/20/2022    Priority: 6.   Atrial fibrillation with RVR ([place]) 12/25/2022   Malnutrition of moderate degree 12/23/2022    Resolved Hospital Problems  No resolved problems to display.   Discharge Condition: Stable   Diet recommendation: Diet Orders (From admission, onward)     Start     Ordered   12/21/22 1249  Diet regular Room service appropriate? Yes; Fluid consistency: Thin  Diet effective now       Question Answer Comment  Room service appropriate? Yes   Fluid consistency: Thin      12/21/22 1248           HPI and Brief Hospital Course:  This is an 89 year old male with a history of hypertension, hyperlipidemia, arthritis prior DVT on Eliquis and Alzheimer's dementia who presented with unwitnessed fall at his facility and was diagnosed with a closed right hip fracture.  He was seen by orthopedic surgery underwent IM nailing on 1/13.  He tolerated this well, required a total of 3 units of blood transfusion since surgery and hemoglobin has since been stable.  He developed new onset A-fib with RVR on 1/16.  Was seen by cardiology started on low-dose metoprolol for rate control.  He had 2D echo performed with preserved EF,  and no significant wall motion or valvular abnormalities.  His Eliquis was also resumed at that time, though it had previously been held due to recent hip surgery and related anemia.  Patient has been intermittently confused and calling out in the hospital, but has been pleasantly confused and not agitated.  Today he has been accepted to rehab facility, he is being discharged there.  He should follow-up with orthopedic surgery as an outpatient as mentioned above, as well as with cardiology to follow-up on his atrial fibrillation which is now rate controlled.  Procedures: Right hip IM nail 1/13  Consultations: Orthopedic surgery Cardiology  Discharge details, plan of care and follow up instructions were discussed with patient and any available family or care providers. Patient and family are in agreement with discharge from the hospital today and all questions were answered to their satisfaction.  Discharge Exam: BP 114/67 (BP Location: Left Arm)   Pulse (!) 110   Temp 98.1 F (36.7 C) (Oral)   Resp 18   Ht 6' (1.829 m)   Wt 86.2 kg   SpO2 94%   BMI 25.77 kg/m  General:  Alert, oriented to self, calm, in no acute distress, working with physical therapy this morning Eyes: EOMI, clear sclerea Neck: supple, no masses, trachea mildline  Cardiovascular: RRR, no murmurs or rubs, no peripheral edema  Respiratory: clear to auscultation bilaterally, no wheezes, no crackles  Abdomen:  soft, nontender, nondistended, normal bowel tones heard  Skin: dry, no rashes  Musculoskeletal: no joint effusions, normal range of motion  Psychiatric: appropriate affect, normal speech  Neurologic: extraocular muscles intact, clear speech, moving all extremities with intact sensorium   Discharge Instructions You were cared for by a hospitalist during your hospital stay. If you have any questions about your discharge medications or the care you received while you were in the hospital after you are discharged, you  can call the unit and asked to speak with the hospitalist on call if the hospitalist that took care of you is not available. Once you are discharged, your primary care physician will handle any further medical issues. Please note that NO REFILLS for any discharge medications will be authorized once you are discharged, as it is imperative that you return to your primary care physician (or establish a relationship with a primary care physician if you do not have one) for your aftercare needs so that they can reassess your need for medications and monitor your lab values.   Allergies as of 12/26/2022       Reactions   Amoxicillin    Sulfa Antibiotics    "I get loopy"        Medication List     STOP taking these medications    ciprofloxacin 250 MG tablet Commonly known as: CIPRO   cyanocobalamin 500 MCG tablet Commonly known as: VITAMIN B12   LORazepam 0.5 MG tablet Commonly known as: ATIVAN   montelukast 10 MG tablet Commonly known as: SINGULAIR   naproxen sodium 220 MG tablet Commonly known as: ALEVE   rivastigmine 1.5 MG capsule Commonly known as: EXELON   sulfamethoxazole-trimethoprim 800-160 MG tablet Commonly known as: BACTRIM DS       TAKE these medications    docusate sodium 100 MG capsule Commonly known as: COLACE Take 1 capsule (100 mg total) by mouth 2 (two) times daily.   Eliquis 5 MG Tabs tablet Generic drug: apixaban Take 5 mg by mouth 2 (two) times daily.   feeding supplement Liqd Take 237 mLs by mouth 3 (three) times daily between meals.   fluticasone 50 MCG/ACT nasal spray Commonly known as: FLONASE Place 1 spray into both nostrils 2 (two) times daily.   HYDROcodone-acetaminophen 5-325 MG tablet Commonly known as: NORCO/VICODIN Take 1 tablet by mouth every 4 (four) hours as needed for moderate pain (pain score 4-6).   lidocaine 5 % Commonly known as: LIDODERM Place 1 patch onto the skin daily. Remove & Discard patch within 12 hours or as  directed by MD Start taking on: December 27, 2022   Melatonin 5 MG Caps Take by mouth.   metoprolol tartrate 50 MG tablet Commonly known as: LOPRESSOR Take 1 tablet (50 mg total) by mouth 2 (two) times daily.   QUEtiapine 50 MG tablet Commonly known as: SEROQUEL Take 1 tablet (50 mg total) by mouth at bedtime. What changed: Another medication with the same name was removed. Continue taking this medication, and follow the directions you see here.   sertraline 25 MG tablet Commonly known as: ZOLOFT Take 1 tablet (25 mg total) by mouth daily.       Allergies  Allergen Reactions   Amoxicillin    Sulfa Antibiotics     "I get loopy"    Contact information for follow-up providers     Maryland Pink, MD Follow up in 2 week(s).   Specialty: Family Medicine Contact information: [facility]  [place] [address] [phone]         Nelva Bush, MD. Schedule an appointment as soon as possible for a visit in 1 month(s).   Specialty: Cardiology Contact information: [place] [address] [phone]              Contact information for after-discharge care     Destination     [facility] Preferred SNF .   Service: Skilled Nursing Contact information: [address] [place] [phone]                     The results of significant diagnostics from this hospitalization (including imaging, microbiology, ancillary and laboratory) are listed below for reference.    Significant Diagnostic Studies: ECHOCARDIOGRAM COMPLETE  Result Date: 12/25/2022    ECHOCARDIOGRAM REPORT   Patient Name:   Era Skeen Date of Exam: 12/24/2022 Medical Rec #:  [id number]        Height:       72.0 in Accession #:    [id number]       Weight:       190.0 lb Date of Birth:  08-Oct-1932        BSA:          2.085 m Patient Age:    72 years         BP:           95/57 mmHg  Patient Gender: M                HR:           106 bpm. Exam Location:  [facility] Procedure: 2D Echo, Cardiac Doppler and Color Doppler Indications:     I48.91 Atrial Fibrillation  History:         Patient has no prior history of Echocardiogram examinations.                  Risk Factors:Hypertension and Dyslipidemia.  Sonographer:     Cresenciano Lick RDCS Referring Phys:  [id number] CHRI[address]HER END Diagnosing Phys: Nelva Bush MD IMPRESSIONS  1. Left ventricular ejection fraction, by estimation, is 60 to 65%. The left ventricle has normal function. The left ventricle has no regional wall motion abnormalities. Left ventricular diastolic function could not be evaluated.  2. Right ventricular systolic function is normal. The right ventricular size is mildly enlarged. There is mildly elevated pulmonary artery systolic pressure.  3. The mitral valve is degenerative. Mild mitral valve regurgitation. No evidence of mitral stenosis. Moderate mitral annular calcification.  4. The aortic valve is tricuspid. There is mild calcification of the aortic valve. There is mild thickening of the aortic valve. Aortic valve regurgitation is trivial. Aortic valve sclerosis/calcification is present, without any evidence of aortic stenosis.  5. There is mild dilatation of the aortic root, measuring 39 mm.  6. The inferior vena cava is dilated in size with <50% respiratory variability, suggesting right atrial pressure of 15 mmHg. FINDINGS  Left Ventricle: Left ventricular ejection fraction, by estimation, is 60 to 65%. The left ventricle has normal function. The left ventricle has no regional wall motion abnormalities. The left ventricular internal cavity size was normal in size. There is  borderline left ventricular hypertrophy. Left ventricular diastolic function could not be evaluated due to atrial fibrillation. Left ventricular diastolic function could not be evaluated. Right Ventricle: The right ventricular size is mildly enlarged. No  increase in right ventricular  wall thickness. Right ventricular systolic function is normal. There is mildly elevated pulmonary artery systolic pressure. The tricuspid regurgitant velocity is 2.65 m/s, and with an assumed right atrial pressure of 15 mmHg, the estimated right ventricular systolic pressure is 94.8 mmHg. Left Atrium: Left atrial size was normal in size. Right Atrium: Right atrial size was normal in size. Pericardium: There is no evidence of pericardial effusion. Mitral Valve: The mitral valve is degenerative in appearance. There is mild thickening of the mitral valve leaflet(s). Moderate mitral annular calcification. Mild mitral valve regurgitation. No evidence of mitral valve stenosis. The mean mitral valve gradient is 3.0 mmHg. Tricuspid Valve: The tricuspid valve is normal in structure. Tricuspid valve regurgitation is mild. Aortic Valve: The aortic valve is tricuspid. There is mild calcification of the aortic valve. There is mild thickening of the aortic valve. There is mild aortic valve annular calcification. Aortic valve regurgitation is trivial. Aortic valve sclerosis/calcification is present, without any evidence of aortic stenosis. Aortic valve mean gradient measures 6.4 mmHg. Aortic valve peak gradient measures 10.9 mmHg. Aortic valve area, by VTI measures 2.48 cm. Pulmonic Valve: The pulmonic valve was grossly normal. Pulmonic valve regurgitation is not visualized. No evidence of pulmonic stenosis. Aorta: The aortic root is normal in size and structure. There is mild dilatation of the aortic root, measuring 39 mm. Pulmonary Artery: The pulmonary artery is not well seen. Venous: The inferior vena cava is dilated in size with less than 50% respiratory variability, suggesting right atrial pressure of 15 mmHg. IAS/Shunts: No atrial level shunt detected by color flow Doppler.  LEFT VENTRICLE PLAX 2D LVIDd:         4.00 cm LVIDs:         2.50 cm LV PW:         1.10 cm LV IVS:        1.10 cm LVOT  diam:     2.20 cm LV SV:         65 LV SV Index:   31 LVOT Area:     3.80 cm  RIGHT VENTRICLE             IVC RV Basal diam:  4.40 cm     IVC diam: 2.20 cm RV S prime:     16.12 cm/s TAPSE (M-mode): 2.4 cm LEFT ATRIUM             Index        RIGHT ATRIUM           Index LA diam:        4.20 cm 2.01 cm/m   RA Area:     15.40 cm LA Vol (A2C):   58.6 ml 28.11 ml/m  RA Volume:   41.40 ml  19.86 ml/m LA Vol (A4C):   59.0 ml 28.30 ml/m LA Biplane Vol: 58.9 ml 28.25 ml/m  AORTIC VALVE AV Area (Vmax):    2.73 cm AV Area (Vmean):   2.55 cm AV Area (VTI):     2.48 cm AV Vmax:           165.41 cm/s AV Vmean:          120.593 cm/s AV VTI:            0.263 m AV Peak Grad:      10.9 mmHg AV Mean Grad:      6.4 mmHg LVOT Vmax:         118.75 cm/s LVOT Vmean:  80.800 cm/s LVOT VTI:          0.172 m LVOT/AV VTI ratio: 0.65  AORTA Ao Root diam: 3.20 cm Ao Asc diam:  3.90 cm MITRAL VALVE                TRICUSPID VALVE MV Mean grad: 3.0 mmHg      TR Peak grad:   28.1 mmHg MV E velocity: 138.25 cm/s  TR Vmax:        265.00 cm/s                              SHUNTS                             Systemic VTI:  0.17 m                             Systemic Diam: 2.20 cm Nelva Bush MD Electronically signed by Nelva Bush MD Signature Date/Time: 12/25/2022/7:32:44 AM    Final    DG FEMUR, MIN 2 VIEWS RIGHT  Result Date: 12/21/2022 CLINICAL DATA:  Status post internal fixation of right hip fracture. EXAM: RIGHT FEMUR 2 VIEWS COMPARISON:  None Available. FINDINGS: Right hip screws and femoral intramedullary rod with distal fixation screws are seen transfixing an intertrochanteric right hip fracture in near anatomic alignment. No evidence of dislocation. Knee prosthesis also noted. IMPRESSION: Internal fixation of intertrochanteric right hip fracture in near anatomic alignment. Electronically Signed   By: Marlaine Hind M.D.   On: 12/21/2022 10:20   DG C-Arm 1-60 Min-No Report  Result Date: 12/21/2022 Fluoroscopy was  utilized by the requesting physician.  No radiographic interpretation.   DG C-Arm 1-60 Min-No Report  Result Date: 12/21/2022 Fluoroscopy was utilized by the requesting physician.  No radiographic interpretation.   CT Head Wo Contrast  Result Date: 12/20/2022 CLINICAL DATA:  Head trauma, minor.  Neck trauma. EXAM: CT HEAD WITHOUT CONTRAST CT CERVICAL SPINE WITHOUT CONTRAST TECHNIQUE: Multidetector CT imaging of the head and cervical spine was performed following the standard protocol without intravenous contrast. Multiplanar CT image reconstructions of the cervical spine were also generated. RADIATION DOSE REDUCTION: This exam was performed according to the departmental dose-optimization program which includes automated exposure control, adjustment of the mA and/or kV according to patient size and/or use of iterative reconstruction technique. COMPARISON:  CT head and cervical spine 01/29/2021. FINDINGS: CT HEAD FINDINGS Brain: No acute intracranial hemorrhage or infarct. No mass effect or midline shift. Stable patchy hypoattenuation of the periventricular white matter, most consistent with chronic small vessel disease. Stable prominence of the ventricles and sulci. No extra-axial collection. Basilar cisterns are patent. Vascular: No hyperdense vessel. Skull: No calvarial fracture.  Skull base is unremarkable. Sinuses/Orbits: Mild mucosal thickening in the right maxillary sinus. Frothy secretions in the left sphenoid sinus. Orbits are unremarkable. Mastoids and middle ear cavities are well aerated. Other: No soft tissue findings. CT CERVICAL SPINE FINDINGS Alignment: Stable 3 mm anterolisthesis of C4 on C5. Skull base and vertebrae: Stable degenerative changes of the C1-2 level. Normal craniocervical junction. Mild motion artifact through the midcervical spine. No acute fracture. Stable moderate degenerative changes of the lower cervical disc spaces. Heterogeneity of the bones, compatible with osteopenia.  Unchanged upper cervical facet arthropathy. Soft tissues and spinal canal: No prevertebral fluid or swelling. No visible canal hematoma. Disc  levels: New high-grade spinal canal stenosis. At least moderate neural foraminal narrowing at nearly every level. Upper chest: Biapical pleural-parenchymal scarring. IMPRESSION: 1. No acute intracranial injury. 2. No acute fracture or traumatic subluxation in the cervical spine. Electronically Signed   By: Emmit Alexanders M.D   On: 12/20/2022 09:13   CT Cervical Spine Wo Contrast  Result Date: 12/20/2022 CLINICAL DATA:  Head trauma, minor.  Neck trauma. EXAM: CT HEAD WITHOUT CONTRAST CT CERVICAL SPINE WITHOUT CONTRAST TECHNIQUE: Multidetector CT imaging of the head and cervical spine was performed following the standard protocol without intravenous contrast. Multiplanar CT image reconstructions of the cervical spine were also generated. RADIATION DOSE REDUCTION: This exam was performed according to the departmental dose-optimization program which includes automated exposure control, adjustment of the mA and/or kV according to patient size and/or use of iterative reconstruction technique. COMPARISON:  CT head and cervical spine 01/29/2021. FINDINGS: CT HEAD FINDINGS Brain: No acute intracranial hemorrhage or infarct. No mass effect or midline shift. Stable patchy hypoattenuation of the periventricular white matter, most consistent with chronic small vessel disease. Stable prominence of the ventricles and sulci. No extra-axial collection. Basilar cisterns are patent. Vascular: No hyperdense vessel. Skull: No calvarial fracture.  Skull base is unremarkable. Sinuses/Orbits: Mild mucosal thickening in the right maxillary sinus. Frothy secretions in the left sphenoid sinus. Orbits are unremarkable. Mastoids and middle ear cavities are well aerated. Other: No soft tissue findings. CT CERVICAL SPINE FINDINGS Alignment: Stable 3 mm anterolisthesis of C4 on C5. Skull base and  vertebrae: Stable degenerative changes of the C1-2 level. Normal craniocervical junction. Mild motion artifact through the midcervical spine. No acute fracture. Stable moderate degenerative changes of the lower cervical disc spaces. Heterogeneity of the bones, compatible with osteopenia. Unchanged upper cervical facet arthropathy. Soft tissues and spinal canal: No prevertebral fluid or swelling. No visible canal hematoma. Disc levels: New high-grade spinal canal stenosis. At least moderate neural foraminal narrowing at nearly every level. Upper chest: Biapical pleural-parenchymal scarring. IMPRESSION: 1. No acute intracranial injury. 2. No acute fracture or traumatic subluxation in the cervical spine. Electronically Signed   By: Emmit Alexanders M.D   On: 12/20/2022 09:13   DG Hip Unilat W or Wo Pelvis 2-3 Views Right  Result Date: 12/20/2022 CLINICAL DATA:  Fall with pain in the right hip EXAM: DG HIP (WITH OR WITHOUT PELVIS) 3V RIGHT COMPARISON:  None Available. FINDINGS: Fracture: Comminuted mildly displaced intertrochanteric fracture of the right femur. Femoroacetabular joint is maintained. Healing: None. Soft tissue: Normal. IMPRESSION: Comminuted, mildly displaced intertrochanteric fracture of the right femur. Electronically Signed   By: Darrin Nipper M.D.   On: 12/20/2022 08:54   DG Chest 1 View  Result Date: 12/20/2022 CLINICAL DATA:  Fall with right hip fracture. Preoperative respiratory exam. EXAM: CHEST  1 VIEW COMPARISON:  05/06/2019 FINDINGS: Top-normal heart size. Probable component some degree chronic lung disease. There is no evidence of pulmonary edema, consolidation, pneumothorax or pleural fluid. The visualized skeletal structures are unremarkable. IMPRESSION: Top-normal heart size with probable component of chronic lung disease. No acute findings. Electronically Signed   By: Aletta Edouard M.D.   On: 12/20/2022 08:53    Microbiology: Recent Results (from the past 240 hour(s))  MRSA Next  Gen by PCR, Nasal     Status: None   Collection Time: 12/21/22 12:42 AM   Specimen: Nasal Mucosa; Nasal Swab  Result Value Ref Range Status   MRSA by PCR Next Gen NOT DETECTED NOT DETECTED Final  Comment: (NOTE) The GeneXpert MRSA Assay (FDA approved for NASAL specimens only), is one component of a comprehensive MRSA colonization surveillance program. It is not intended to diagnose MRSA infection nor to guide or monitor treatment for MRSA infections. Test performance is not FDA approved in patients less than 27 years old. Performed at [facility], [address] [place], [place] [address]      Labs: Basic Metabolic Panel: Recent Labs  Lab 12/20/22 [phone] 12/21/22 5643 12/22/22 0545 12/23/22 0556 12/25/22 0453  NA 135 134* 133* 132* 133*  K 4.1 4.1 3.9 4.1 4.1  CL 104 105 105 105 105  CO2 '23 23 24 23 23  '$ GLUCOSE 131* 132* 116* 117* 116*  BUN '15 17 20 20 '$ 25*  CREATININE 1.10 1.05 1.08 1.10 1.18  CALCIUM 8.2* 7.9* 7.8* 7.7* 8.0*  MG  --   --   --   --  2.2   Liver Function Tests: No results for input(s): "AST", "ALT", "ALKPHOS", "BILITOT", "PROT", "ALBUMIN" in the last 168 hours. No results for input(s): "LIPASE", "AMYLASE" in the last 168 hours. No results for input(s): "AMMONIA" in the last 168 hours. CBC: Recent Labs  Lab 12/20/22 0834 12/20/22 1215 12/21/22 2221 12/22/22 0545 12/23/22 0556 12/23/22 1533 12/24/22 0229 12/24/22 1447 12/25/22 0453  WBC 6.4   < > 9.6 8.8 7.5  --  7.5  --  7.9  NEUTROABS 3.6  --   --   --   --   --   --   --   --   HGB 9.1*   < > 7.6* 7.6* 6.7* 8.2* 7.2* 9.7* 9.0*  HCT 30.2*   < > 24.2* 24.1* 20.8* 25.3* 22.3* 30.1* 28.3*  MCV 82.3   < > 83.2 81.7 80.9  --  82.6  --  83.5  PLT 260   < > 148* 146* 156  --  163  --  217   < > = values in this interval not displayed.   Cardiac Enzymes: No results for input(s): "CKTOTAL", "CKMB", "CKMBINDEX", "TROPONINI" in the last 168 hours. BNP: BNP (last 3 results) No results for  input(s): "BNP" in the last 8760 hours.  ProBNP (last 3 results) No results for input(s): "PROBNP" in the last 8760 hours.  CBG: No results for input(s): "GLUCAP" in the last 168 hours.  Time spent: > 30 minutes were spent in preparing this discharge including medication reconciliation, counseling, and coordination of care.  Signed:  Kalecia Hartney Marry Guan, MD  [facility] 12/26/2022, 11:08 AM

## 2022-12-26 NOTE — Progress Notes (Signed)
Pt to be d/c to SNF. Report called to Apolonio Schneiders, LPN at [facility], awaiting EMS transport.

## 2022-12-26 NOTE — Progress Notes (Signed)
Physical Therapy Treatment Patient Details Name: Curtis West MRN: [id number] DOB: March 12, 1932 Today's Date: 12/26/2022   History of Present Illness Patient is a 89 year old male with closed right hip fracture s/p IM nail following a fall at memory care unit. History of Alzheimer's dementia, HTN, arthritis, DVT.    PT Comments    Patient seen for PT session. He was lethargic during session and still has pain with active and passive movement of RLE. Encouraged patient to participate with LE exercises in bed with assistance and guidance from therapist. He continues to require assistance with bed mobility. He will need SNF placement at discharge.    Recommendations for follow up therapy are one component of a multi-disciplinary discharge planning process, led by the attending physician.  Recommendations may be updated based on patient status, additional functional criteria and insurance authorization.  Follow Up Recommendations  Skilled nursing-short term rehab (<3 hours/day) Can patient physically be transported by private vehicle: No   Assistance Recommended at Discharge Frequent or constant Supervision/Assistance  Patient can return home with the following Two people to help with walking and/or transfers;A lot of help with bathing/dressing/bathroom;Help with stairs or ramp for entrance;Assist for transportation   Equipment Recommendations   (to be determined at next level of care)    Recommendations for Other Services       Precautions / Restrictions Precautions Precautions: Fall Restrictions Weight Bearing Restrictions: Yes RLE Weight Bearing: Weight bearing as tolerated     Mobility  Bed Mobility Overal bed mobility: Needs Assistance Bed Mobility: Rolling Rolling: Total assist         General bed mobility comments: total assistance for rolling to left. cues for technique. further mobility not attempted due to lethargy    Transfers                         Ambulation/Gait                   Stairs             Wheelchair Mobility    Modified Rankin (Stroke Patients Only)       Balance                                            Cognition Arousal/Alertness: Lethargic Behavior During Therapy: Flat affect Overall Cognitive Status: History of cognitive impairments - at baseline                                 General Comments: patient groggy today. patient can follow single step commands inconsistently        Exercises Total Joint Exercises Ankle Circles/Pumps: AAROM, Strengthening, Both, 10 reps, Supine Short Arc Quad: AAROM, Strengthening, Right, 10 reps, Supine Heel Slides: AROM, Strengthening, Left, 10 reps, Supine Hip ABduction/ADduction: AAROM, Strengthening, Both, 10 reps, Supine Straight Leg Raises: AROM, Strengthening, Left, 10 reps, Supine Other Exercises Other Exercises: verbal cues for participation and for technique. patient has increased pain the right hip with active and passive movement    General Comments        Pertinent Vitals/Pain Pain Assessment Pain Assessment: Faces Faces Pain Scale: Hurts even more Pain Location: R hip with movement Pain Descriptors / Indicators: Discomfort, Grimacing, Guarding Pain Intervention(s): Limited activity within patient's tolerance,  Monitored during session, Premedicated before session, Repositioned    Home Living                          Prior Function            PT Goals (current goals can now be found in the care plan section) Acute Rehab PT Goals Patient Stated Goal: to get up PT Goal Formulation: With patient Time For Goal Achievement: 01/05/23 Potential to Achieve Goals: Poor Progress towards PT goals: Progressing toward goals    Frequency    7X/week      PT Plan Current plan remains appropriate    Co-evaluation              AM-PAC PT "6 Clicks" Mobility   Outcome Measure   Help needed turning from your back to your side while in a flat bed without using bedrails?: A Lot Help needed moving from lying on your back to sitting on the side of a flat bed without using bedrails?: Total Help needed moving to and from a bed to a chair (including a wheelchair)?: Total Help needed standing up from a chair using your arms (e.g., wheelchair or bedside chair)?: Total Help needed to walk in hospital room?: Total Help needed climbing 3-5 steps with a railing? : Total 6 Click Score: 7    End of Session   Activity Tolerance: Patient limited by fatigue Patient left: in bed;with call bell/phone within reach;with bed alarm set;with SCD's reapplied Nurse Communication: Mobility status PT Visit Diagnosis: Difficulty in walking, not elsewhere classified (R26.2);Pain Pain - Right/Left: Right Pain - part of body: Hip     Time: 1696-7893 PT Time Calculation (min) (ACUTE ONLY): 8 min  Charges:  $Therapeutic Exercise: 8-22 mins                     Minna Merritts, PT, MPT    Percell Locus 12/26/2022, 11:54 AM

## 2022-12-26 NOTE — Progress Notes (Signed)
Progress Note  Patient Name: Curtis West Date of Encounter: 12/26/2022  Primary Cardiologist: new - consult by End  Subjective   Somnolent, denies pain. Remains in Afib with ventricular rates largely in the 90s to low 100s bpm.   Inpatient Medications    Scheduled Meds:  (feeding supplement) PROSource Plus  30 mL Oral BID BM   apixaban  5 mg Oral BID   docusate sodium  100 mg Oral BID   Fe Fum-Vit C-Vit B12-FA  1 capsule Oral BID WC   feeding supplement  237 mL Oral TID BM   fluticasone  1 spray Each Nare BID   lidocaine  1 patch Transdermal Q24H   melatonin  5 mg Oral QHS   metoprolol tartrate  50 mg Oral BID   multivitamin with minerals  1 tablet Oral Daily   QUEtiapine  50 mg Oral QHS   sertraline  25 mg Oral Daily   Continuous Infusions:  PRN Meds: acetaminophen, hydrALAZINE, HYDROcodone-acetaminophen, HYDROcodone-acetaminophen, LORazepam, menthol-cetylpyridinium **OR** phenol, methocarbamol, metoCLOPramide **OR** metoCLOPramide (REGLAN) injection, morphine injection, ondansetron **OR** ondansetron (ZOFRAN) IV, senna-docusate   Vital Signs    Vitals:   12/25/22 0901 12/25/22 1643 12/25/22 2335 12/26/22 0830  BP: 139/78 134/87 138/76 114/67  Pulse: (!) 102 (!) 110 88 (!) 110  Resp: '18 18 20 18  '$ Temp: 97.9 F (36.6 C) 98.4 F (36.9 C) 98 F (36.7 C) 98.1 F (36.7 C)  TempSrc:  Oral Oral Oral  SpO2: 95% 97% 98% 94%  Weight:      Height:        Intake/Output Summary (Last 24 hours) at 12/26/2022 1104 Last data filed at 12/26/2022 0730 Gross per 24 hour  Intake --  Output 800 ml  Net -800 ml   Filed Weights   12/20/22 0804  Weight: 86.2 kg    Telemetry    Afib with ventricular rates in the 90s to low 100s, brief episode into the 140s last evening - Personally Reviewed  ECG    No new tracings - Personally Reviewed  Physical Exam   GEN: Somnolent.   Neck: No JVD. Cardiac: IRIR, no murmurs, rubs, or gallops.  Respiratory: Clear to  auscultation bilaterally.  GI: Soft, nontender, non-distended.   MS: No edema; No deformity. Neuro:  Somnolent.  Psych: Somnolent.  Labs    Chemistry Recent Labs  Lab 12/22/22 0545 12/23/22 0556 12/25/22 0453  NA 133* 132* 133*  K 3.9 4.1 4.1  CL 105 105 105  CO2 '24 23 23  '$ GLUCOSE 116* 117* 116*  BUN 20 20 25*  CREATININE 1.08 1.10 1.18  CALCIUM 7.8* 7.7* 8.0*  GFRNONAA >60 >60 59*  ANIONGAP 4* 4* 5     Hematology Recent Labs  Lab 12/23/22 0556 12/23/22 1533 12/24/22 0229 12/24/22 1447 12/25/22 0453  WBC 7.5  --  7.5  --  7.9  RBC 2.57*  2.59*  --  2.70*  --  3.39*  HGB 6.7*   < > 7.2* 9.7* 9.0*  HCT 20.8*   < > 22.3* 30.1* 28.3*  MCV 80.9  --  82.6  --  83.5  MCH 26.1  --  26.7  --  26.5  MCHC 32.2  --  32.3  --  31.8  RDW 14.8  --  15.6*  --  15.7*  PLT 156  --  163  --  217   < > = values in this interval not displayed.    Cardiac EnzymesNo results for input(s): "TROPONINI"  in the last 168 hours. No results for input(s): "TROPIPOC" in the last 168 hours.   BNPNo results for input(s): "BNP", "PROBNP" in the last 168 hours.   DDimer No results for input(s): "DDIMER" in the last 168 hours.   Radiology       Cardiac Studies   2D echo 12/24/2022: 1. Left ventricular ejection fraction, by estimation, is 60 to 65%. The  left ventricle has normal function. The left ventricle has no regional  wall motion abnormalities. Left ventricular diastolic function could not  be evaluated.   2. Right ventricular systolic function is normal. The right ventricular  size is mildly enlarged. There is mildly elevated pulmonary artery  systolic pressure.   3. The mitral valve is degenerative. Mild mitral valve regurgitation. No  evidence of mitral stenosis. Moderate mitral annular calcification.   4. The aortic valve is tricuspid. There is mild calcification of the  aortic valve. There is mild thickening of the aortic valve. Aortic valve  regurgitation is trivial.  Aortic valve sclerosis/calcification is present,  without any evidence of aortic  stenosis.   5. There is mild dilatation of the aortic root, measuring 39 mm.   6. The inferior vena cava is dilated in size with <50% respiratory  variability, suggesting right atrial pressure of 15 mmHg.   Patient Profile     89 y.o. male with history of hypertension, hyperlipidemia, DVT on chronic anticoagulation with apixaban, arthritis, and Alzheimer's dementia, who is being seen for postoperative atrial fibrillation.  Assessment & Plan    1. New onset postoperative Afib: -Presented in sinus rhythm with the development of Afib postoperatively -Remains in Afib with reasonably controlled ventricular response -PTA already on [place] with history of DVT -Consolidate Lopressor to 50 mg bid  -If he becomes tachycardic with rehab, may need to add amiodarone with plans to pursue rhythm control -Not a good candidate for digoxin given advanced age, baseline renal dysfunction, and poor oral intake -BP precludes further titration of metoprolol at this time -CHADS2VASc 4     For questions or updates, please contact [place] Please consult [email or web address] for contact info under Cardiology/STEMI.    Signed, Christell Faith, PA-C [facility] Pager: [phone] 12/26/2022, 11:04 AM

## 2022-12-26 NOTE — Plan of Care (Signed)
  Problem: Education: Goal: Knowledge of General Education information will improve Description: Including pain rating scale, medication(s)/side effects and non-pharmacologic comfort measures Outcome: Progressing   Problem: Clinical Measurements: Goal: Ability to maintain clinical measurements within normal limits will improve Outcome: Progressing   Problem: Coping: Goal: Level of anxiety will decrease Outcome: Progressing   Problem: Elimination: Goal: Will not experience complications related to bowel motility Outcome: Progressing   Problem: Nutrition: Goal: Adequate nutrition will be maintained Outcome: Not Progressing

## 2022-12-26 NOTE — Anesthesia Preprocedure Evaluation (Signed)
Anesthesia Evaluation  Patient identified by MRN, date of birth, ID band Patient awake    Reviewed: Allergy & Precautions, H&P , NPO status , Patient's Chart, lab work & pertinent test results, reviewed documented beta blocker date and time   History of Anesthesia Complications Negative for: history of anesthetic complications  Airway Mallampati: I  TM Distance: >3 FB Neck ROM: full    Dental  (+) Dental Advidsory Given, Chipped, Poor Dentition   Pulmonary neg shortness of breath, asthma , sleep apnea , neg COPD, neg recent URI, former smoker   Pulmonary exam normal breath sounds clear to auscultation       Cardiovascular Exercise Tolerance: Good hypertension, (-) angina + DVT  (-) Past MI and (-) Cardiac Stents Normal cardiovascular exam(-) dysrhythmias (-) Valvular Problems/Murmurs Rhythm:regular Rate:Normal     Neuro/Psych  PSYCHIATRIC DISORDERS Anxiety Depression    negative neurological ROS     GI/Hepatic Neg liver ROS,GERD  ,,  Endo/Other  negative endocrine ROS    Renal/GU negative Renal ROS  negative genitourinary   Musculoskeletal   Abdominal   Peds  Hematology  (+) Blood dyscrasia, anemia   Anesthesia Other Findings Past Medical History: No date: Allergy     Comment:  Seasonal Allergies No date: Alzheimer disease ([place]) No date: Arthritis No date: Asthma No date: GERD (gastroesophageal reflux disease) No date: Hyperlipidemia No date: Hypertension No date: Rosacea No date: Skin cancer   Reproductive/Obstetrics negative OB ROS                              Anesthesia Physical Anesthesia Plan  ASA: 3  Anesthesia Plan: General   Post-op Pain Management:    Induction: Intravenous  PONV Risk Score and Plan: 2 and Ondansetron, Dexamethasone and Treatment may vary due to age or medical condition  Airway Management Planned: Oral ETT  Additional Equipment:   Intra-op  Plan:   Post-operative Plan: Extubation in OR  Informed Consent: I have reviewed the patients History and Physical, chart, labs and discussed the procedure including the risks, benefits and alternatives for the proposed anesthesia with the patient or authorized representative who has indicated his/her understanding and acceptance.     Dental Advisory Given  Plan Discussed with: Anesthesiologist, CRNA and Surgeon  Anesthesia Plan Comments:          Anesthesia Quick Evaluation

## 2022-12-26 NOTE — TOC Progression Note (Signed)
Transition of Care [facility]) - Progression Note    Patient Details  Name: Curtis West MRN: [id number] Date of Birth: Apr 24, 1932  [facility] CM/SW [place], RN Phone Number: 12/26/2022, 11:19 AM  Clinical Narrative:     Patient going to room 607B At [facility], I called the son Shanon Brow and let him know I called EMS to arrange transport 2nd on list   Expected Discharge Plan: [place] Barriers to Discharge: SNF Pending bed offer, Insurance Authorization  Expected Discharge Plan and Services   Discharge Planning Services: CM Consult   Living arrangements for the past 2 months: [place] Expected Discharge Date: 12/26/22                                     Social Determinants of Health (SDOH) Interventions SDOH Screenings   Tobacco Use: Medium Risk (12/23/2022)    Readmission Risk Interventions     No data to display

## 2022-12-26 NOTE — Care Management Important Message (Signed)
Important Message  Patient Details  Name: Curtis West MRN: [id number] Date of Birth: [birth year]   Medicare Important Message Given:  Yes     Juliann Pulse A Harles Evetts 12/26/2022, 11:10 AM

## 2022-12-26 NOTE — Progress Notes (Signed)
Curtis West to be D/C'd Skilled nursing facility([facility]) per MD order.Report given to [facility] LPN at [facility]. Allergies as of 12/26/2022       Reactions   Amoxicillin    Sulfa Antibiotics    "I get loopy"        Medication List     STOP taking these medications    ciprofloxacin 250 MG tablet Commonly known as: CIPRO   cyanocobalamin 500 MCG tablet Commonly known as: VITAMIN B12   LORazepam 0.5 MG tablet Commonly known as: ATIVAN   montelukast 10 MG tablet Commonly known as: SINGULAIR   naproxen sodium 220 MG tablet Commonly known as: ALEVE   rivastigmine 1.5 MG capsule Commonly known as: EXELON   sulfamethoxazole-trimethoprim 800-160 MG tablet Commonly known as: BACTRIM DS       TAKE these medications    docusate sodium 100 MG capsule Commonly known as: COLACE Take 1 capsule (100 mg total) by mouth 2 (two) times daily.   Eliquis 5 MG Tabs tablet Generic drug: apixaban Take 5 mg by mouth 2 (two) times daily.   feeding supplement Liqd Take 237 mLs by mouth 3 (three) times daily between meals.   fluticasone 50 MCG/ACT nasal spray Commonly known as: FLONASE Place 1 spray into both nostrils 2 (two) times daily.   HYDROcodone-acetaminophen 5-325 MG tablet Commonly known as: NORCO/VICODIN Take 1 tablet by mouth every 4 (four) hours as needed for moderate pain (pain score 4-6).   lidocaine 5 % Commonly known as: LIDODERM Place 1 patch onto the skin daily. Remove & Discard patch within 12 hours or as directed by MD Start taking on: December 27, 2022   Melatonin 5 MG Caps Take by mouth.   metoprolol tartrate 50 MG tablet Commonly known as: LOPRESSOR Take 1 tablet (50 mg total) by mouth 2 (two) times daily.   QUEtiapine 50 MG tablet Commonly known as: SEROQUEL Take 1 tablet (50 mg total) by mouth at bedtime. What changed: Another medication with the same name was removed. Continue taking this medication, and follow the directions you see here.    sertraline 25 MG tablet Commonly known as: ZOLOFT Take 1 tablet (25 mg total) by mouth daily.        Vitals:   12/26/22 0830 12/26/22 1202  BP: 114/67 97/77  Pulse: (!) 110 (!) 108  Resp: 18 18  Temp: 98.1 F (36.7 C) 98 F (36.7 C)  SpO2: 94% 97%    Tele box removed and returned. IV catheter discontinued intact. Site without signs and symptoms of complications  An After Visit Summary was printed and sent with patient. Patient escorted via stretcher, and D/C to SNF via EMS.  Rolley Sims

## 2022-12-26 NOTE — Anesthesia Postprocedure Evaluation (Signed)
Anesthesia Post Note  Patient: Careers adviser  Procedure(s) Performed: INTRAMEDULLARY (IM) NAIL INTERTROCHANTERIC (Right: Hip)  Patient location during evaluation: PACU Anesthesia Type: General Level of consciousness: awake and alert Pain management: pain level controlled Vital Signs Assessment: post-procedure vital signs reviewed and stable Respiratory status: spontaneous breathing, nonlabored ventilation, respiratory function stable and patient connected to nasal cannula oxygen Cardiovascular status: blood pressure returned to baseline and stable Postop Assessment: no apparent nausea or vomiting Anesthetic complications: no   No notable events documented.   Last Vitals:  Vitals:   12/26/22 0830 12/26/22 1202  BP: 114/67 97/77  Pulse: (!) 110 (!) 108  Resp: 18 18  Temp: 36.7 C 36.7 C  SpO2: 94% 97%    Last Pain:  Vitals:   12/26/22 1202  TempSrc: Oral  PainSc:                  Martha Clan

## 2023-01-17 ENCOUNTER — Ambulatory Visit: Payer: Commercial Managed Care - HMO | Attending: Internal Medicine | Admitting: Internal Medicine

## 2023-01-17 ENCOUNTER — Encounter: Payer: Self-pay | Admitting: Internal Medicine

## 2023-01-17 VITALS — BP 100/58 | HR 105 | Ht 72.0 in

## 2023-01-17 DIAGNOSIS — Z86718 Personal history of other venous thrombosis and embolism: Secondary | ICD-10-CM

## 2023-01-17 DIAGNOSIS — I4891 Unspecified atrial fibrillation: Secondary | ICD-10-CM

## 2023-01-17 DIAGNOSIS — I4819 Other persistent atrial fibrillation: Secondary | ICD-10-CM | POA: Diagnosis not present

## 2023-01-17 NOTE — Progress Notes (Unsigned)
   Follow-up Outpatient Visit Date: 01/17/2023  Primary Care Provider: Maryland Pink, MD [place] [address]  Chief Complaint: ***  HPI:  [name] is a 89 y.o. male with history of recently diagnosed postoperative atrial fibrillation, hypertension, hyperlipidemia, DVT on chronic anticoagulation with apixaban, arthritis, and Alzheimer's dementia, who presents for follow-up of atrial fibrillation.  I met him during hospitalization last month.  He had fallen and fractured his right hip.  He developed postoperative atrial fibrillation with rapid ventricular response.  He was placed on oral metoprolol and apixaban.  --------------------------------------------------------------------------------------------------  Past Medical History:  Diagnosis Date   Allergy    Seasonal Allergies   Alzheimer disease ([place])    Arthritis    Asthma    GERD (gastroesophageal reflux disease)    Hyperlipidemia    Hypertension    Rosacea    Skin cancer    Past Surgical History:  Procedure Laterality Date   INTRAMEDULLARY (IM) NAIL INTERTROCHANTERIC Right 12/21/2022   Procedure: INTRAMEDULLARY (IM) NAIL INTERTROCHANTERIC;  Surgeon: Renee Harder, MD;  Location: [facility] ORS;  Service: Orthopedics;  Laterality: Right;   REPLACEMENT TOTAL KNEE BILATERAL Bilateral    SINUS LIFT WITH BONE GRAFT     TONSILLECTOMY      No outpatient medications have been marked as taking for the 01/17/23 encounter (Appointment) with Jyair Kiraly, Harrell Gave, MD.    Allergies: Amoxicillin and Sulfa antibiotics  Social History   Tobacco Use   Smoking status: Former   Smokeless tobacco: Never  Substance Use Topics   Alcohol use: Not Currently   Drug use: Not Currently    Family History  Problem Relation Age of Onset   ALS Father 39    Review of Systems: A 12-system review of systems was performed and was negative except as noted in the  HPI.  --------------------------------------------------------------------------------------------------  Physical Exam: There were no vitals taken for this visit.  General:  NAD. Neck: No JVD or HJR. Lungs: Clear to auscultation bilaterally without wheezes or crackles. Heart: Regular rate and rhythm without murmurs, rubs, or gallops. Abdomen: Soft, nontender, nondistended. Extremities: No lower extremity edema.  EKG:  ***  Lab Results  Component Value Date   WBC 7.9 12/25/2022   HGB 9.0 (L) 12/25/2022   HCT 28.3 (L) 12/25/2022   MCV 83.5 12/25/2022   PLT 217 12/25/2022    Lab Results  Component Value Date   NA 133 (L) 12/25/2022   K 4.1 12/25/2022   CL 105 12/25/2022   CO2 23 12/25/2022   BUN 25 (H) 12/25/2022   CREATININE 1.18 12/25/2022   GLUCOSE 116 (H) 12/25/2022   ALT 16 12/15/2019    No results found for: "CHOL", "HDL", "LDLCALC", "LDLDIRECT", "TRIG", "CHOLHDL"  --------------------------------------------------------------------------------------------------  ASSESSMENT AND PLAN: ***  Nelva Bush, MD 01/17/2023 10:36 AM

## 2023-01-17 NOTE — Patient Instructions (Signed)
Medication Instructions:  Your physician recommends the following medication changes.  STOP TAKING: Eliquis   *If you need a refill on your cardiac medications before your next appointment, please call your pharmacy*   Lab Work: None ordered today   Testing/Procedures: None ordered today   Follow-Up: At [facility], you and your health needs are our priority.  As part of our continuing mission to provide you with exceptional heart care, we have created designated Provider Care Teams.  These Care Teams include your primary Cardiologist (physician) and Advanced Practice Providers (APPs -  Physician Assistants and Nurse Practitioners) who all work together to provide you with the care you need, when you need it.  We recommend signing up for the patient portal called "MyChart".  Sign up information is provided on this After Visit Summary.  MyChart is used to connect with patients for Virtual Visits (Telemedicine).  Patients are able to view lab/test results, encounter notes, upcoming appointments, etc.  Non-urgent messages can be sent to your provider as well.   To learn more about what you can do with MyChart, go to [email or web address].    Your next appointment:   3 month(s)  Provider:   You may see Nelva Bush, MD or one of the following Advanced Practice Providers on your designated Care Team:   Murray Hodgkins, NP Christell Faith, PA-C Cadence Kathlen Mody, PA-C Gerrie Nordmann, NP

## 2023-01-18 ENCOUNTER — Encounter: Payer: Self-pay | Admitting: Internal Medicine

## 2023-01-18 DIAGNOSIS — I4819 Other persistent atrial fibrillation: Secondary | ICD-10-CM | POA: Insufficient documentation

## 2023-01-18 DIAGNOSIS — Z86718 Personal history of other venous thrombosis and embolism: Secondary | ICD-10-CM | POA: Insufficient documentation

## 2023-03-10 DEATH — deceased

## 2023-04-17 ENCOUNTER — Ambulatory Visit: Payer: Self-pay | Admitting: Internal Medicine
